# Patient Record
Sex: Male | Born: 1975 | Race: Black or African American | Hispanic: No | Marital: Single | Smoking: Current every day smoker
Health system: Southern US, Community
[De-identification: ages and names within clinical notes are randomized; demographics above are authoritative.]

## PROBLEM LIST (undated history)

## (undated) DIAGNOSIS — E119 Type 2 diabetes mellitus without complications: Secondary | ICD-10-CM

## (undated) DIAGNOSIS — M549 Dorsalgia, unspecified: Secondary | ICD-10-CM

---

## 2010-08-28 ENCOUNTER — Emergency Department (HOSPITAL_COMMUNITY)
Admission: EM | Admit: 2010-08-28 | Discharge: 2010-08-28 | Disposition: A | Discharge status: Home / Self Care | Payer: BC Managed Care – PPO | Attending: Emergency Medicine | Admitting: Emergency Medicine

## 2010-08-28 ENCOUNTER — Emergency Department (HOSPITAL_COMMUNITY): Payer: BC Managed Care – PPO

## 2010-08-28 DIAGNOSIS — M545 Low back pain, unspecified: Secondary | ICD-10-CM | POA: Insufficient documentation

## 2010-08-28 DIAGNOSIS — M51379 Other intervertebral disc degeneration, lumbosacral region without mention of lumbar back pain or lower extremity pain: Secondary | ICD-10-CM | POA: Insufficient documentation

## 2010-08-28 DIAGNOSIS — M79609 Pain in unspecified limb: Secondary | ICD-10-CM | POA: Insufficient documentation

## 2010-08-28 DIAGNOSIS — M5137 Other intervertebral disc degeneration, lumbosacral region: Secondary | ICD-10-CM | POA: Insufficient documentation

## 2012-01-17 ENCOUNTER — Encounter (HOSPITAL_COMMUNITY): Payer: Self-pay | Admitting: Emergency Medicine

## 2012-01-17 ENCOUNTER — Emergency Department (HOSPITAL_COMMUNITY)
Admission: EM | Admit: 2012-01-17 | Discharge: 2012-01-17 | Disposition: A | Discharge status: Home / Self Care | Payer: BC Managed Care – PPO | Source: Home / Self Care | Attending: Emergency Medicine | Admitting: Emergency Medicine

## 2012-01-17 DIAGNOSIS — M545 Low back pain: Secondary | ICD-10-CM

## 2012-01-17 MED ORDER — TRIAMCINOLONE ACETONIDE 40 MG/ML IJ SUSP
60.0000 mg | Freq: Once | INTRAMUSCULAR | Status: AC
  Administered 2012-01-17: 60 mg via INTRAMUSCULAR

## 2012-01-17 MED ORDER — METHYLPREDNISOLONE 4 MG PO KIT: PACK | ORAL | Status: AC

## 2012-01-17 MED ORDER — CYCLOBENZAPRINE HCL 10 MG PO TABS: 10.0000 mg | ORAL_TABLET | Freq: Two times a day (BID) | ORAL | Status: AC | PRN

## 2012-01-17 MED ORDER — TRIAMCINOLONE ACETONIDE 40 MG/ML IJ SUSP
INTRAMUSCULAR | Status: AC
  Filled 2012-01-17: qty 5

## 2012-01-17 MED ORDER — TRAMADOL HCL 50 MG PO TABS: 50.0000 mg | ORAL_TABLET | Freq: Four times a day (QID) | ORAL | Status: AC | PRN
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
## 2012-01-17 NOTE — ED Notes (Signed)
Pt here with right lower sudden sharp back pain radiating to left leg intermit.sx started last Sunday then worsened with diff bending,stand or sit.ibuprofen use.denies urine problems or injury

## 2012-01-18 NOTE — ED Provider Notes (Signed)
Medical screening examination/treatment/procedure(s) were performed by non-physician practitioner and as supervising physician I was immediately available for consultation/collaboration.  Luiz Blare MD   Luiz Blare, MD 01/18/12 2102

## 2012-05-19 ENCOUNTER — Emergency Department (HOSPITAL_COMMUNITY)
Admission: EM | Admit: 2012-05-19 | Discharge: 2012-05-19 | Disposition: A | Discharge status: Home / Self Care | Payer: BC Managed Care – PPO | Source: Home / Self Care | Attending: Emergency Medicine | Admitting: Emergency Medicine

## 2012-05-19 ENCOUNTER — Encounter (HOSPITAL_COMMUNITY): Payer: Self-pay | Admitting: Emergency Medicine

## 2012-05-19 DIAGNOSIS — M5430 Sciatica, unspecified side: Secondary | ICD-10-CM

## 2012-05-19 DIAGNOSIS — M5431 Sciatica, right side: Secondary | ICD-10-CM

## 2012-05-19 MED ORDER — TRIAMCINOLONE ACETONIDE 40 MG/ML IJ SUSP
INTRAMUSCULAR | Status: AC
  Filled 2012-05-19: qty 5

## 2012-05-19 MED ORDER — TRIAMCINOLONE ACETONIDE 40 MG/ML IJ SUSP
60.0000 mg | Freq: Once | INTRAMUSCULAR | Status: AC
  Administered 2012-05-19: 60 mg via INTRAMUSCULAR

## 2012-05-19 MED ORDER — TRAMADOL HCL 50 MG PO TABS: 50.0000 mg | ORAL_TABLET | Freq: Four times a day (QID) | ORAL | Status: DC | PRN

## 2012-05-19 MED ORDER — CYCLOBENZAPRINE HCL 10 MG PO TABS: 10.0000 mg | ORAL_TABLET | Freq: Two times a day (BID) | ORAL | Status: DC | PRN

## 2012-05-19 MED ORDER — METHYLPREDNISOLONE 4 MG PO KIT: PACK | ORAL | Status: DC

## 2012-05-19 NOTE — ED Provider Notes (Signed)
History     CSN: [id number]  Arrival date & time 05/19/12  1010   First MD Initiated Contact with Patient 05/19/12 1325      Chief Complaint  Patient presents with  . Back Pain    (Consider location/radiation/quality/duration/timing/severity/associated sxs/prior treatment) HPI Comments: Pt with similar sx this summer. Received tx here, sx resolved completely.  Pain in R lower back/R buttock that will radiate down his R leg and make his R leg feel numb/tingly if he sits. Carries wallet in R back pocket.  Works doing lots of walking and sitting.  Sx much worse with sitting.   Patient is a 36 y.o. male presenting with back pain. The history is provided by the patient.  Back Pain  This is a new problem. The current episode started more than 1 week ago. The problem occurs constantly. The problem has been gradually worsening. The pain is associated with no known injury. The pain is present in the lumbar spine and gluteal region. The quality of the pain is described as shooting. The pain radiates to the right thigh. The pain is severe. The symptoms are aggravated by certain positions. The pain is the same all the time. Associated symptoms include numbness and tingling. Pertinent negatives include no fever, no abdominal pain, no bowel incontinence, no perianal numbness, no bladder incontinence and no weakness. He has tried NSAIDs for the symptoms. The treatment provided no relief.    History reviewed. No pertinent past medical history.  History reviewed. No pertinent past surgical history.  History reviewed. No pertinent family history.  History  Substance Use Topics  . Smoking status: Current Every Day Smoker  . Smokeless tobacco: Not on file  . Alcohol Use: Yes      Review of Systems  Constitutional: Negative for fever and chills.  Gastrointestinal: Negative for abdominal pain and bowel incontinence.  Genitourinary: Negative for bladder incontinence and difficulty urinating.    Musculoskeletal: Positive for back pain.  Skin: Negative for rash and wound.  Neurological: Positive for tingling and numbness. Negative for weakness.    Allergies  Review of patient's allergies indicates no known allergies.  Home Medications   Current Outpatient Rx  Name  Route  Sig  Dispense  Refill  . CYCLOBENZAPRINE HCL 10 MG PO TABS   Oral   Take 1 tablet (10 mg total) by mouth 2 (two) times daily as needed for muscle spasms.   20 tablet   0   . METHYLPREDNISOLONE 4 MG PO KIT      follow package directions   21 tablet   0   . TRAMADOL HCL 50 MG PO TABS   Oral   Take 1 tablet (50 mg total) by mouth every 6 (six) hours as needed for pain.   15 tablet   0     BP 153/93  Pulse 57  Temp 98.8 F (37.1 C) (Oral)  Resp 19  SpO2 97%  Physical Exam  Constitutional: He appears well-developed and well-nourished.       Appears uncomfortable  Musculoskeletal:       Lumbar back: He exhibits tenderness. He exhibits no bony tenderness, no swelling, no edema, no deformity and no spasm.       Back:  Neurological: He has normal strength. No sensory deficit.  Reflex Scores:      Patellar reflexes are 2+ on the right side and 2+ on the left side.      Achilles reflexes are 2+ on the right side and  2+ on the left side. Skin: Skin is warm, dry and intact. No rash noted.    ED Course  Procedures (including critical care time)  Labs Reviewed - No data to display No results found.   1. Sciatica of right side       MDM  Pt requests tx he received in July since it worked so well for him.  Rx tramadol, flexeril and medrol as before. Given IM Kenalog as before.         Cathlyn Parsons, NP 05/19/12 1334
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
## 2012-05-19 NOTE — ED Provider Notes (Signed)
Medical screening examination/treatment/procedure(s) were performed by non-physician practitioner and as supervising physician I was immediately available for consultation/collaboration.  Leslee Home, M.D.   Reuben Likes, MD 05/19/12 [phone]

## 2014-01-14 ENCOUNTER — Other Ambulatory Visit: Payer: Self-pay | Admitting: Internal Medicine

## 2014-01-14 DIAGNOSIS — M545 Low back pain, unspecified: Secondary | ICD-10-CM

## 2014-01-22 ENCOUNTER — Ambulatory Visit
Admission: RE | Admit: 2014-01-22 | Discharge: 2014-01-22 | Disposition: A | Discharge status: Home / Self Care | Payer: BC Managed Care – PPO | Source: Ambulatory Visit | Attending: Internal Medicine | Admitting: Internal Medicine

## 2014-01-22 DIAGNOSIS — M545 Low back pain, unspecified: Secondary | ICD-10-CM

## 2014-02-01 ENCOUNTER — Emergency Department (HOSPITAL_COMMUNITY)
Admission: EM | Admit: 2014-02-01 | Discharge: 2014-02-01 | Disposition: A | Discharge status: Nursing Home (Includes ICF) | Payer: BC Managed Care – PPO | Source: Home / Self Care | Attending: Family Medicine | Admitting: Family Medicine

## 2014-02-01 ENCOUNTER — Encounter (HOSPITAL_COMMUNITY): Payer: Self-pay | Admitting: Emergency Medicine

## 2014-02-01 ENCOUNTER — Emergency Department (HOSPITAL_COMMUNITY)
Admission: EM | Admit: 2014-02-01 | Discharge: 2014-02-02 | Disposition: A | Discharge status: Home / Self Care | Payer: BC Managed Care – PPO | Attending: Emergency Medicine | Admitting: Emergency Medicine

## 2014-02-01 ENCOUNTER — Emergency Department (HOSPITAL_COMMUNITY): Payer: BC Managed Care – PPO

## 2014-02-01 DIAGNOSIS — R1115 Cyclical vomiting syndrome unrelated to migraine: Secondary | ICD-10-CM

## 2014-02-01 DIAGNOSIS — Z791 Long term (current) use of non-steroidal anti-inflammatories (NSAID): Secondary | ICD-10-CM | POA: Insufficient documentation

## 2014-02-01 DIAGNOSIS — R1013 Epigastric pain: Secondary | ICD-10-CM | POA: Insufficient documentation

## 2014-02-01 DIAGNOSIS — F17200 Nicotine dependence, unspecified, uncomplicated: Secondary | ICD-10-CM | POA: Insufficient documentation

## 2014-02-01 DIAGNOSIS — R112 Nausea with vomiting, unspecified: Secondary | ICD-10-CM | POA: Insufficient documentation

## 2014-02-01 HISTORY — DX: Dorsalgia, unspecified: M54.9

## 2014-02-01 LAB — COMPREHENSIVE METABOLIC PANEL
ALBUMIN: 4.4 g/dL (ref 3.5–5.2)
ALK PHOS: 106 U/L (ref 39–117)
ALT: 55 U/L — AB (ref 0–53)
AST: 30 U/L (ref 0–37)
Anion gap: 15 (ref 5–15)
BUN: 9 mg/dL (ref 6–23)
CALCIUM: 9.1 mg/dL (ref 8.4–10.5)
CO2: 21 mEq/L (ref 19–32)
Chloride: 102 mEq/L (ref 96–112)
Creatinine, Ser: 0.72 mg/dL (ref 0.50–1.35)
GFR calc non Af Amer: >90 mL/min (ref >90)
GLUCOSE: 150 mg/dL — AB (ref 70–99)
POTASSIUM: 4.2 meq/L (ref 3.7–5.3)
SODIUM: 138 meq/L (ref 137–147)
TOTAL PROTEIN: 7.5 g/dL (ref 6.0–8.3)
Total Bilirubin: 0.4 mg/dL (ref 0.3–1.2)

## 2014-02-01 LAB — LIPASE, BLOOD: Lipase: 23 U/L (ref 11–59)

## 2014-02-01 LAB — POCT URINALYSIS DIP (DEVICE)
BILIRUBIN URINE: NEGATIVE
GLUCOSE, UA: NEGATIVE mg/dL
KETONES UR: NEGATIVE mg/dL
LEUKOCYTES UA: NEGATIVE
NITRITE: NEGATIVE
Protein, ur: NEGATIVE mg/dL
Specific Gravity, Urine: 1.02 (ref 1.005–1.030)
Urobilinogen, UA: 0.2 mg/dL (ref 0.0–1.0)
pH: 7 (ref 5.0–8.0)

## 2014-02-01 LAB — CBC
HCT: 44.1 % (ref 39.0–52.0)
HEMOGLOBIN: 15.2 g/dL (ref 13.0–17.0)
MCH: 31.1 pg (ref 26.0–34.0)
MCHC: 34.5 g/dL (ref 30.0–36.0)
MCV: 90.2 fL (ref 78.0–100.0)
PLATELETS: 226 10*3/uL (ref 150–400)
RBC: 4.89 MIL/uL (ref 4.22–5.81)
RDW: 12.6 % (ref 11.5–15.5)
WBC: 10.4 10*3/uL (ref 4.0–10.5)

## 2014-02-01 LAB — OCCULT BLOOD, POC DEVICE: Fecal Occult Bld: POSITIVE — AB

## 2014-02-01 MED ORDER — IOHEXOL 300 MG/ML  SOLN
25.0000 mL | INTRAMUSCULAR | Status: AC
  Administered 2014-02-01 (×2): 25 mL via ORAL

## 2014-02-01 MED ORDER — HYDROMORPHONE HCL PF 1 MG/ML IJ SOLN
1.0000 mg | Freq: Once | INTRAMUSCULAR | Status: AC
  Administered 2014-02-02: 1 mg via INTRAVENOUS
  Filled 2014-02-01: qty 1

## 2014-02-01 MED ORDER — PANTOPRAZOLE SODIUM 20 MG PO TBEC: 20.0000 mg | DELAYED_RELEASE_TABLET | Freq: Every day | ORAL | Status: DC

## 2014-02-01 MED ORDER — ONDANSETRON HCL 4 MG/2ML IJ SOLN
INTRAMUSCULAR | Status: AC
  Filled 2014-02-01: qty 2

## 2014-02-01 MED ORDER — ONDANSETRON HCL 4 MG/2ML IJ SOLN
4.0000 mg | Freq: Once | INTRAMUSCULAR | Status: AC
  Administered 2014-02-01: 4 mg via INTRAVENOUS

## 2014-02-01 MED ORDER — FENTANYL CITRATE 0.05 MG/ML IJ SOLN
INTRAMUSCULAR | Status: AC
  Administered 2014-02-01: 50 ug via INTRAVENOUS
  Filled 2014-02-01: qty 2

## 2014-02-01 MED ORDER — SODIUM CHLORIDE 0.9 % IV SOLN
Freq: Once | INTRAVENOUS | Status: AC
  Administered 2014-02-01: 15:00:00 via INTRAVENOUS

## 2014-02-01 MED ORDER — ONDANSETRON 8 MG PO TBDP: 8.0000 mg | ORAL_TABLET | Freq: Three times a day (TID) | ORAL | Status: DC | PRN

## 2014-02-01 MED ORDER — ONDANSETRON HCL 4 MG/2ML IJ SOLN
INTRAMUSCULAR | Status: AC
  Administered 2014-02-01: 4 mg via INTRAVENOUS
  Filled 2014-02-01: qty 2

## 2014-02-01 MED ORDER — OXYCODONE-ACETAMINOPHEN 5-325 MG PO TABS: 1.0000 | ORAL_TABLET | ORAL | Status: DC | PRN

## 2014-02-01 MED ORDER — PANTOPRAZOLE SODIUM 40 MG IV SOLR
80.0000 mg | Freq: Once | INTRAVENOUS | Status: AC
Start: 2014-02-01 — End: 2014-02-01
  Administered 2014-02-01: 80 mg via INTRAVENOUS
  Filled 2014-02-01: qty 80

## 2014-02-01 MED ORDER — IOHEXOL 300 MG/ML  SOLN
100.0000 mL | Freq: Once | INTRAMUSCULAR | Status: AC | PRN
  Administered 2014-02-01: 100 mL via INTRAVENOUS

## 2014-02-01 MED ORDER — FENTANYL CITRATE 0.05 MG/ML IJ SOLN
50.0000 ug | Freq: Once | INTRAMUSCULAR | Status: AC
  Administered 2014-02-01: 50 ug via INTRAVENOUS

## 2014-02-01 NOTE — ED Notes (Signed)
Pt reports generalized abd cramping that awoke him from his sleep approx 03:00 this morning - admits to associating n/v/d as well - denies any known fever. Pt's family concerned for "food poisoning"

## 2014-02-01 NOTE — ED Notes (Signed)
Pt. had 950 cc IV fluids.  Vomited 50 cc clear liquid after IV Zofran.  Pt. assisted to BR to obtain a urine sample.  Pt. c/o abdominal pain and sweating after vomiting.  PA notified and said pt. needs transferred by shuttle.  IV D/C down to saline lock.  VS rechecked by tech. Vassie MoselleYork, Rody Keadle M 02/01/2014

## 2014-02-01 NOTE — Discharge Instructions (Signed)

## 2014-02-01 NOTE — ED Provider Notes (Signed)
Medical screening examination/treatment/procedure(s) were performed by resident physician or non-physician practitioner and as supervising physician I was immediately available for consultation/collaboration.   Dastan Krider DOUGLAS MD.   Matalyn Nawaz D Careena Degraffenreid, MD 02/01/14 1747 

## 2014-02-01 NOTE — ED Notes (Signed)
C/o upper abdominal pain.  Pain woke him at 0330.  Vomited 7-8 times and diarrhea x 5.  States he ate a cheeseburger and salad bar from [employer] [place] yesterday  @ 1500 and 2 sausages last night.  No fever. Not been around anyone sick with same symptoms.

## 2014-02-01 NOTE — ED Provider Notes (Signed)
CSN: [id number]     Arrival date & time 02/01/14  1437 History   First MD Initiated Contact with Patient 02/01/14 1459     Chief Complaint  Patient presents with  . Abdominal Pain   (Consider location/radiation/quality/duration/timing/severity/associated sxs/prior Treatment) HPI Comments: Denies urinary symptoms. Denies hematemesis or melena. No heavy ETOH use Is a smoker No previous surgeries.  Is taking diclofenac as prescribed for chronic back pain No recent travel No fever Works for Arch[employer] No known ill contacts  Patient is a 38 y.o. male presenting with abdominal pain. The history is provided by the patient.  Abdominal Pain Pain location:  Epigastric Pain quality: sharp   Pain severity:  Severe Onset quality:  Sudden (Woke patient from sleep at 330am) Timing:  Constant Progression:  Worsening Chronicity:  New Associated symptoms: belching, diarrhea, nausea and vomiting   Associated symptoms: no chest pain, no chills, no cough, no dysuria, no fatigue, no fever, no flatus, no hematemesis, no hematochezia, no hematuria, no melena, no shortness of breath, no sore throat, no vaginal bleeding and no vaginal discharge     Past Medical History  Diagnosis Date  . 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 No pertinent past surgical history. History reviewed. No pertinent family history. History  Substance Use Topics  . Smoking status: Current Every Day Smoker -- 0.75 packs/day    Types: Cigarettes  . Smokeless tobacco: Not on file  . Alcohol Use: 2.4 oz/week    4 Cans of beer per week    Review of Systems  Constitutional: Negative for fever, chills and fatigue.  HENT: Negative for sore throat.   Respiratory: Negative for cough and shortness of breath.   Cardiovascular: Negative for chest pain.  Gastrointestinal: Positive for nausea, vomiting, abdominal pain and diarrhea. Negative for melena, hematochezia, flatus and  hematemesis.  Genitourinary: Negative for dysuria, hematuria, vaginal bleeding and vaginal discharge.  All other systems reviewed and are negative.   Allergies  Review of patient's allergies indicates no known allergies.  Home Medications   Prior to Admission medications   Medication Sig Start Date End Date Taking? Authorizing Provider  diclofenac (VOLTAREN) 50 MG EC tablet Take 50 mg by mouth 2 (two) times daily.   Yes Historical Provider, MD  cyclobenzaprine (FLEXERIL) 10 MG tablet Take 1 tablet (10 mg total) by mouth 2 (two) times daily as needed for muscle spasms. 05/19/12   Cathlyn Parsons, NP  methylPREDNISolone (MEDROL, PAK,) 4 MG tablet follow package directions 05/19/12   Cathlyn Parsons, NP  traMADol (ULTRAM) 50 MG tablet Take 1 tablet (50 mg total) by mouth every 6 (six) hours as needed for pain. 05/19/12   Cathlyn Parsons, NP   Pulse 48  Temp(Src) 98.3 F (36.8 C) (Oral)  SpO2 99% Physical Exam  Nursing note and vitals reviewed. Constitutional: He is oriented to person, place, and time. He appears well-developed and well-nourished. No distress.  +appears uncomfortable  HENT:  Head: Normocephalic and atraumatic.  Eyes: Conjunctivae are normal. No scleral icterus.  Neck: Normal range of motion. Neck supple.  Cardiovascular: Regular rhythm and normal heart sounds.  Bradycardia present.   Pulmonary/Chest: Effort normal and breath sounds normal.  Abdominal: Soft. Normal appearance. He exhibits no distension and no mass. Bowel sounds are decreased. There is no hepatosplenomegaly. There is tenderness in the epigastric area. There is no rigidity, no rebound, no guarding and no CVA tenderness.  Genitourinary:  Guaiac positive stool.  Musculoskeletal: Normal range of motion.  Neurological: He is alert and oriented to person, place, and time.  Skin: Skin is warm and dry. No rash noted. No erythema.  Psychiatric: He has a normal mood and affect. His behavior is normal.    ED Course   Procedures (including critical care time) Labs Review Labs Reviewed  COMPREHENSIVE METABOLIC PANEL - Abnormal; Notable for the following:    Glucose, Bld 150 (*)    ALT 55 (*)    All other components within normal limits  POCT URINALYSIS DIP (DEVICE) - Abnormal; Notable for the following:    Hgb urine dipstick TRACE (*)    All other components within normal limits  CBC  LIPASE, BLOOD  OCCULT BLOOD X 1 CARD TO LAB, STOOL    Imaging Review No results found.   MDM   1. Epigastric pain   2. Persistent vomiting in adult patient    Guiac positive stools CBC normal CMET grossly normal.  Lipase normal UA only with trace hematuria 12 lead EKG: sinus bradycardia at 47 bpm, otherwise normal. Hemodynamically stable.  Patient given 1L [facility] at [facility] and 4 mg IV Zofran and continues to vomit and report persistent epigastric discomfort. ??Gastritis from recent course of NSAIDs. Will transfer for continued hydration, IV antiemetics,  IV PPIs, +/-admission for observation.    Jess BartersJennifer Lee [place], [place] 02/01/14 [phone]

## 2014-02-01 NOTE — ED Provider Notes (Signed)
CSN: [id number]     Arrival date & time 02/01/14  1709 History   First MD Initiated Contact with Patient 02/01/14 2027     Chief Complaint  Patient presents with  . Abdominal Pain  . Nausea  . Emesis     (Consider location/radiation/quality/duration/timing/severity/associated sxs/prior Treatment) HPI 38 year old male transferred here from urgent care Center after presenting for abdominal pain. He states that he woke up (the morning with epigastric pain. It is severe. He has had nausea and vomiting. He has had several loose stools. He has not had fever or chills. He has not noted any bright red blood or dark stool for blood in emesis. He is taking diclofenac for back pain. He denies any history of gastritis, ulcer disease, or pancreatitis. He does drink some alcohol but does not drink excessive amounts of alcohol. He had 24 ounces of beer last night. Past Medical History  Diagnosis Date  . 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 No pertinent past surgical history. No family history on file. History  Substance Use Topics  . Smoking status: Current Every Day Smoker -- 0.75 packs/day    Types: Cigarettes  . Smokeless tobacco: Not on file  . Alcohol Use: 2.4 oz/week    4 Cans of beer per week    Review of Systems  All other systems reviewed and are negative.     Allergies  Review of patient's allergies indicates no known allergies.  Home Medications   Prior to Admission medications   Medication Sig Start Date End Date Taking? Authorizing Provider  diclofenac (VOLTAREN) 50 MG EC tablet Take 50 mg by mouth 2 (two) times daily.   Yes Historical Provider, MD   BP 132/84  Pulse 52  Temp(Src) 99.2 F (37.3 C) (Oral)  Resp 16  Ht 5\' 8"  (1.727 m)  Wt 176 lb (79.833 kg)  BMI 26.77 kg/m2  SpO2 99% Physical Exam  Nursing note and vitals reviewed. Constitutional: He is oriented to person, place, and time. He appears well-developed and  well-nourished.  HENT:  Head: Normocephalic and atraumatic.  Right Ear: External ear normal.  Left Ear: External ear normal.  Nose: Nose normal.  Mouth/Throat: Oropharynx is clear and moist.  Eyes: Conjunctivae and EOM are normal. Pupils are equal, round, and reactive to light.  Neck: Normal range of motion. Neck supple.  Cardiovascular: Normal rate, regular rhythm, normal heart sounds and intact distal pulses.   Pulmonary/Chest: Effort normal and breath sounds normal. No respiratory distress. He has no wheezes. He exhibits no tenderness.  Abdominal: Soft. Bowel sounds are normal. He exhibits no distension and no mass. There is tenderness. There is no guarding.    Musculoskeletal: Normal range of motion.  Neurological: He is alert and oriented to person, place, and time. He has normal reflexes. He exhibits normal muscle tone. Coordination normal.  Skin: Skin is warm and dry.  Psychiatric: He has a normal mood and affect. His behavior is normal. Judgment and thought content normal.    ED Course  Procedures (including critical care time) Labs Review Labs Reviewed - No data to display  Imaging Review Ct Abdomen Pelvis W Contrast  02/01/2014   CLINICAL DATA:  Abdominal pain.  EXAM: CT ABDOMEN AND PELVIS WITH CONTRAST  TECHNIQUE: Multidetector CT imaging of the abdomen and pelvis was performed using the standard protocol following bolus administration of intravenous contrast.  CONTRAST:  <MEASUREME4<MEASUREMENT75mT> OMNIPAQUE IOHEXOL 300 MG/ML  SOLN  COMPARISON:  None.  FINDINGS: BODY WALL: Unremarkable.  LOWER CHEST: Unremarkable.  ABDOMEN/PELVIS:  Liver: Diffuse fatty infiltration, sparing the gallbladder fossa. No focal abnormality.  Biliary: No evidence of biliary obstruction or stone.  Pancreas: Unremarkable.  Spleen: Unremarkable.  Adrenals: Unremarkable.  Kidneys and ureters: No hydronephrosis or stone.  Bladder: Unremarkable.  Reproductive: Unremarkable.  Bowel: No obstruction. Few colonic diverticula  distally. No inflammatory wall thickening. Negative appendix. Retroperitoneum: No mass or adenopathy.  Peritoneum: No ascites or pneumoperitoneum.  Vascular: No acute abnormality.Extensive for age iliac atherosclerotic calcification.  OSSEOUS: Central disc herniations at L4-5 and L5-S1, narrowing the spinal canal. Disc narrowing and endplate spurring at L5-S1 causes notable bilateral foraminal stenosis.  IMPRESSION: 1. No acute intra-abdominal findings. 2. Atherosclerosis and hepatic steatosis.   Electronically Signed   By: Tiburcio PeaJonathan  Watts M.D.   On: 02/01/2014 23:23     EKG Interpretation None      MDM   Final diagnoses:  Epigastric pain        Hilario Quarryanielle S Corrado Hymon, MD 02/02/14 1239

## 2014-02-01 NOTE — ED Notes (Signed)
Pt from urgent care for further eval of abdominal pain with N/v onset 0300 today.

## 2016-01-15 ENCOUNTER — Emergency Department (HOSPITAL_COMMUNITY)
Admission: EM | Admit: 2016-01-15 | Discharge: 2016-01-15 | Disposition: A | Discharge status: Home / Self Care | Payer: Self-pay | Attending: Emergency Medicine | Admitting: Emergency Medicine

## 2016-01-15 ENCOUNTER — Encounter (HOSPITAL_COMMUNITY): Payer: Self-pay

## 2016-01-15 DIAGNOSIS — E139 Other specified diabetes mellitus without complications: Secondary | ICD-10-CM | POA: Insufficient documentation

## 2016-01-15 DIAGNOSIS — F17210 Nicotine dependence, cigarettes, uncomplicated: Secondary | ICD-10-CM | POA: Insufficient documentation

## 2016-01-15 LAB — I-STAT VENOUS BLOOD GAS, ED
Acid-base deficit: 1 mmol/L (ref 0.0–2.0)
Bicarbonate: 23.5 mEq/L (ref 20.0–24.0)
O2 Saturation: 87 %
PCO2 VEN: 39 mmHg — AB (ref 45.0–50.0)
PH VEN: 7.388 — AB (ref 7.250–7.300)
TCO2: 25 mmol/L (ref 0–100)
pO2, Ven: 54 mmHg — ABNORMAL HIGH (ref 31.0–45.0)

## 2016-01-15 LAB — CBG MONITORING, ED
GLUCOSE-CAPILLARY: 390 mg/dL — AB (ref 65–99)
Glucose-Capillary: 254 mg/dL — ABNORMAL HIGH (ref 65–99)

## 2016-01-15 LAB — BASIC METABOLIC PANEL
ANION GAP: 12 (ref 5–15)
BUN: 9 mg/dL (ref 6–20)
CHLORIDE: 96 mmol/L — AB (ref 101–111)
CO2: 21 mmol/L — AB (ref 22–32)
Calcium: 9.4 mg/dL (ref 8.9–10.3)
Creatinine, Ser: 1.01 mg/dL (ref 0.61–1.24)
GFR calc Af Amer: >60 mL/min (ref >60)
GFR calc non Af Amer: >60 mL/min (ref >60)
GLUCOSE: 420 mg/dL — AB (ref 65–99)
Potassium: 3.7 mmol/L (ref 3.5–5.1)
Sodium: 129 mmol/L — ABNORMAL LOW (ref 135–145)

## 2016-01-15 LAB — URINALYSIS, ROUTINE W REFLEX MICROSCOPIC
Bilirubin Urine: NEGATIVE
Glucose, UA: >1000 mg/dL — AB
Ketones, ur: 40 mg/dL — AB
Leukocytes, UA: NEGATIVE
Nitrite: NEGATIVE
Protein, ur: NEGATIVE mg/dL
Specific Gravity, Urine: 1.041 — ABNORMAL HIGH (ref 1.005–1.030)
pH: 5.5 (ref 5.0–8.0)

## 2016-01-15 LAB — CBC WITH DIFFERENTIAL/PLATELET
Basophils Absolute: 0 10*3/uL (ref 0.0–0.1)
Basophils Relative: 0 %
Eosinophils Absolute: 0.1 10*3/uL (ref 0.0–0.7)
Eosinophils Relative: 1 %
HCT: 43 % (ref 39.0–52.0)
Hemoglobin: 15.4 g/dL (ref 13.0–17.0)
Lymphocytes Relative: 22 %
Lymphs Abs: 1.9 10*3/uL (ref 0.7–4.0)
MCH: 30.9 pg (ref 26.0–34.0)
MCHC: 35.8 g/dL (ref 30.0–36.0)
MCV: 86.3 fL (ref 78.0–100.0)
Monocytes Absolute: 0.3 10*3/uL (ref 0.1–1.0)
Monocytes Relative: 4 %
Neutro Abs: 6.4 10*3/uL (ref 1.7–7.7)
Neutrophils Relative %: 73 %
Platelets: 261 10*3/uL (ref 150–400)
RBC: 4.98 MIL/uL (ref 4.22–5.81)
RDW: 11.5 % (ref 11.5–15.5)
WBC: 8.7 10*3/uL (ref 4.0–10.5)

## 2016-01-15 LAB — URINE MICROSCOPIC-ADD ON
Bacteria, UA: NONE SEEN
Squamous Epithelial / LPF: NONE SEEN
WBC, UA: NONE SEEN WBC/hpf (ref 0–5)

## 2016-01-15 MED ORDER — METFORMIN HCL 500 MG PO TABS: 500.0000 mg | ORAL_TABLET | Freq: Two times a day (BID) | ORAL | Status: DC

## 2016-01-15 MED ORDER — SODIUM CHLORIDE 0.9 % IV BOLUS (SEPSIS)
1000.0000 mL | Freq: Once | INTRAVENOUS | Status: AC
  Administered 2016-01-15: 1000 mL via INTRAVENOUS

## 2016-01-15 NOTE — ED Notes (Addendum)
Pt. sts he has noticed that his vision has been blurry the past few days and increase in thirst and urination, and fatigue. 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 CBG in 300's in triage

## 2016-01-15 NOTE — ED Notes (Signed)
D/C  Instructions reviewed with patient and pt. Verbalizes understanding of follow up appoint,emts. Pt.vitals are stable and he is leaving ambulatory alone. Pt. Denies questions at this time

## 2016-01-15 NOTE — Discharge Instructions (Signed)
Take the prescribed medication as directed.  It is common for this medication to cause some GI upset so monitor for any diarrhea, nausea, or vomiting. If symptoms become uncontrollable, stop this medication. Watch your diet for carbohydrates and sugar intake. Make sure you're drinking plenty of water to stay hydrated. Follow-up with your primary care doctor-- no labs or on the back for their review. Return to the ED for new or worsening symptoms.

## 2016-01-15 NOTE — ED Provider Notes (Signed)
CSN: [id number]     Arrival date & time 01/15/16  4098 History   First MD Initiated Contact with Patient 01/15/16 [phone]     Chief Complaint  Patient presents with  . Blurred Vision  . Weakness     (Consider location/radiation/quality/duration/timing/severity/associated sxs/prior Treatment) Patient is a 40 y.o. male presenting with weakness. The history is provided by the patient and medical records.  Weakness Associated symptoms include fatigue.    40 y.o. m with hx of chronic back pain, presenting to the ED for blurred vision, increased thirst, and urinary frequency for the past 4 days.  Patient states he works outside and in order to stay hydrated he has been drinking a lot of gatorade.  He states One of his colleagues informed him that Gatorade is high in sugar so he switched to water only for the past 2 days. He states he continues to have some intermittently blurred vision and urinary frequency. States he overall feels fatigued. No dysuria, hematuria, flank pain, abdominal pain, nausea, vomiting, or diarrhea.  He denies any headache, dizziness, confusion, numbness, or weakness. No neck pain, fever, or chills. He states his father is a diabetic and when he told him about his symptoms. Encouraged him to come and be seen. Patient has no other medical problems. He has never been diagnosed with diabetes.  VSS.  Past Medical History  Diagnosis Date  . 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 No pertinent past surgical history. No family history on file. Social History  Substance Use Topics  . Smoking status: Current Every Day Smoker -- 0.75 packs/day    Types: Cigarettes  . Smokeless tobacco: None  . Alcohol Use: 2.4 oz/week    4 Cans of beer per week    Review of Systems  Constitutional: Positive for fatigue.  Eyes: Positive for visual disturbance.  Genitourinary: Positive for frequency.  All other systems reviewed and are  negative.     Allergies  Review of patient's allergies indicates no known allergies.  Home Medications   Prior to Admission medications   Medication Sig Start Date End Date Taking? Authorizing Provider  diclofenac (VOLTAREN) 50 MG EC tablet Take 50 mg by mouth 2 (two) times daily.    Historical Provider, MD  ondansetron (ZOFRAN ODT) 8 MG disintegrating tablet Take 1 tablet (8 mg total) by mouth every 8 (eight) hours as needed for nausea or vomiting. 02/01/14   Margarita Grizzle, MD  oxyCODONE-acetaminophen (PERCOCET/ROXICET) 5-325 MG per tablet Take 1 tablet by mouth every 4 (four) hours as needed for severe pain. 02/01/14   Margarita Grizzle, MD  pantoprazole (PROTONIX) 20 MG tablet Take 1 tablet (20 mg total) by mouth daily. 02/01/14   Margarita Grizzle, MD   Temp(Src) 98.6 F (37 C) (Oral)  Ht  (1.702 m)  Wt 77.111 kg  BMI 26.62 kg/m2   Physical Exam  Constitutional: He is oriented to person, place, and time. He appears well-developed and well-nourished.  HENT:  Head: Normocephalic and atraumatic.  Mouth/Throat: Oropharynx is clear and moist.  Mucous membranes dry  Eyes: Conjunctivae and EOM are normal. Pupils are equal, round, and reactive to light.  EOMs fully intact, normal confrontation, no field cuts  Neck: Normal range of motion. Neck supple.  Cardiovascular: Normal rate, regular rhythm and normal heart sounds.   Pulmonary/Chest: Effort normal and breath sounds normal. No respiratory distress. He has no wheezes.  Abdominal: Soft. Bowel sounds are normal.  There is no tenderness. There is no guarding.  Musculoskeletal: Normal range of motion.  Neurological: He is alert and oriented to person, place, and time.  AAOx3, answering questions and following commands appropriately; equal strength UE and LE bilaterally; CN grossly intact; moves all extremities appropriately without ataxia; no focal neuro deficits or facial asymmetry appreciated  Skin: Skin is warm and dry.  Psychiatric: He has a  normal mood and affect.  Nursing note and vitals reviewed.   ED Course  Procedures (including critical care time) Labs Review Labs Reviewed  BASIC METABOLIC PANEL - Abnormal; Notable for the following:    Sodium 129 (*)    Chloride 96 (*)    CO2 21 (*)    Glucose, Bld 420 (*)    All other components within normal limits  URINALYSIS, ROUTINE W REFLEX MICROSCOPIC (NOT AT [facility]) - Abnormal; Notable for the following:    Specific Gravity, Urine 1.041 (*)    Glucose, UA >1000 (*)    Hgb urine dipstick TRACE (*)    Ketones, ur 40 (*)    All other components within normal limits  CBG MONITORING, ED - Abnormal; Notable for the following:    Glucose-Capillary 390 (*)    All other components within normal limits  I-STAT VENOUS BLOOD GAS, ED - Abnormal; Notable for the following:    pH, Ven 7.388 (*)    pCO2, Ven 39.0 (*)    pO2, Ven 54.0 (*)    All other components within normal limits  CBG MONITORING, ED - Abnormal; Notable for the following:    Glucose-Capillary 254 (*)    All other components within normal limits  CBC WITH DIFFERENTIAL/PLATELET  URINE MICROSCOPIC-ADD ON    Imaging Review No results found. I have personally reviewed and evaluated these images and lab results as part of my medical decision-making.   EKG Interpretation None      MDM   Final diagnoses:  Diabetes mellitus of other type without complication [facility](HCC)   40 year old male here with intermittently blurred vision, polyuria, and polydipsia for the past 2 days. He is not a known diabetic. His CBG on arrival was 390. Concern for new onset diabetes. Will obtain labs including CBC, BMP, venous blood gas, urinalysis. Patient given IV fluids.  Neurologic exam non-focal.  11:47 AM   Labs are overall reassuring.  VBG WNL, anion gap remains normal at 12-- clinically not DKA.  renal function preserved.  Patient was fluid resuscitated with 2 L normal saline and states he is feeling better.  His blood sugar has  decreased to around 250.  VS remain stable. Feel he is stable for discharge. We'll start her on low-dose metformin twice a day. He will follow-up with his primary care doctor this week.  Discussed plan with patient, he/she acknowledged understanding and agreed with plan of care.  Return precautions given for new or worsening symptoms.  Garlon HatchetLisa M Rakisha Pincock, PA-C 01/15/16 1241  Derwood KaplanAnkit Nanavati, MD 01/16/16 [phone]

## 2016-02-08 ENCOUNTER — Encounter (HOSPITAL_COMMUNITY): Payer: Self-pay | Admitting: *Deleted

## 2016-02-08 ENCOUNTER — Emergency Department (HOSPITAL_COMMUNITY)
Admission: EM | Admit: 2016-02-08 | Discharge: 2016-02-08 | Disposition: A | Discharge status: Home / Self Care | Payer: Self-pay | Attending: Emergency Medicine | Admitting: Emergency Medicine

## 2016-02-08 DIAGNOSIS — Y939 Activity, unspecified: Secondary | ICD-10-CM | POA: Insufficient documentation

## 2016-02-08 DIAGNOSIS — S0502XA Injury of conjunctiva and corneal abrasion without foreign body, left eye, initial encounter: Secondary | ICD-10-CM | POA: Insufficient documentation

## 2016-02-08 DIAGNOSIS — F17210 Nicotine dependence, cigarettes, uncomplicated: Secondary | ICD-10-CM | POA: Insufficient documentation

## 2016-02-08 DIAGNOSIS — H109 Unspecified conjunctivitis: Secondary | ICD-10-CM | POA: Insufficient documentation

## 2016-02-08 DIAGNOSIS — Z7984 Long term (current) use of oral hypoglycemic drugs: Secondary | ICD-10-CM | POA: Insufficient documentation

## 2016-02-08 DIAGNOSIS — Y999 Unspecified external cause status: Secondary | ICD-10-CM | POA: Insufficient documentation

## 2016-02-08 DIAGNOSIS — Y929 Unspecified place or not applicable: Secondary | ICD-10-CM | POA: Insufficient documentation

## 2016-02-08 DIAGNOSIS — E119 Type 2 diabetes mellitus without complications: Secondary | ICD-10-CM | POA: Insufficient documentation

## 2016-02-08 DIAGNOSIS — X58XXXA Exposure to other specified factors, initial encounter: Secondary | ICD-10-CM | POA: Insufficient documentation

## 2016-02-08 HISTORY — DX: Type 2 diabetes mellitus without complications: E11.9

## 2016-02-08 LAB — CBG MONITORING, ED: Glucose-Capillary: 125 mg/dL — ABNORMAL HIGH (ref 65–99)

## 2016-02-08 MED ORDER — TETRACAINE HCL 0.5 % OP SOLN
1.0000 [drp] | Freq: Once | OPHTHALMIC | Status: AC
  Administered 2016-02-08: 1 [drp] via OPHTHALMIC
  Filled 2016-02-08: qty 2

## 2016-02-08 MED ORDER — FLUORESCEIN SODIUM 1 MG OP STRP
1.0000 | ORAL_STRIP | Freq: Once | OPHTHALMIC | Status: AC
  Administered 2016-02-08: 1 via OPHTHALMIC
  Filled 2016-02-08: qty 1

## 2016-02-08 MED ORDER — CIPROFLOXACIN HCL 0.3 % OP SOLN
1.0000 [drp] | OPHTHALMIC | Status: DC
  Administered 2016-02-08: 1 [drp] via OPHTHALMIC
  Filled 2016-02-08: qty 2.5

## 2016-02-08 NOTE — Discharge Instructions (Signed)
Use the eye drops 1 drop in left eye every two hours while awake tonight and tomorrow and then 1 drop in the left eye every 4 hours while awake.  You Must call Dr. Marnee SpringMcCuen's office in the morning and tell them you were seen in the ED and told to call for follow up. They should schedule you to come in in the next day or two. Return here as needed.

## 2016-02-08 NOTE — ED Triage Notes (Signed)
Left eye redness, swelling and drainage

## 2016-02-08 NOTE — ED Provider Notes (Signed)
[facility]-EMERGENCY DEPT Provider Note   CSN: [id number] Arrival date & time: 02/08/16  1813  First Provider Contact:  None    By signing my name below, I, Majel Homer, attest that this documentation has been prepared under the direction and in the presence of non-physician practitioner, Kerrie Buffalo, NP. Electronically Signed: Majel Homer, Scribe. 02/08/2016. 7:25 PM.  History   Chief Complaint Chief Complaint  Patient presents with  . Eye Problem   The history is provided by the patient. No language interpreter was used.   HPI Comments: Eugene Myers is a 40 y.o. male with PMHx of DM, who presents to the Emergency Department complaining of gradually worsening, left eye swelling and drainage that began 2 weeks ago and worsened today. Pt reports he woke up one morning and experienced spontaneous eye watering, redness and drainage; he notes he continually rubbed his eye because he felt like it "had something in it." He notes he wears contacts but has not worn them since the onset of his left eye pain. He notes he has used Pataday and over the counter medication to treat pink eye with no relief. He denies pain in his left eye, cough, congestion, blurry vision, and contacts at home with similar symptoms. 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   Past Medical History:  Diagnosis Date  . Back pain    MRI low back-2 discs with nerve impingement  . Diabetes mellitus without complication (HCC)    There are no active problems to display for this patient.  History reviewed. No pertinent surgical history.  Home Medications    Prior to Admission medications   Medication Sig Start Date End Date Taking? Authorizing Provider  diclofenac (VOLTAREN) 50 MG EC tablet Take 50 mg by mouth 2 (two) times daily.    Historical Provider, MD  metFORMIN (GLUCOPHAGE) 500 MG tablet Take 1 tablet (500 mg total) by mouth 2 (two) times daily with a meal. 01/15/16    Garlon Hatchet, PA-C  ondansetron (ZOFRAN ODT) 8 MG disintegrating tablet Take 1 tablet (8 mg total) by mouth every 8 (eight) hours as needed for nausea or vomiting. 02/01/14   Margarita Grizzle, MD  oxyCODONE-acetaminophen (PERCOCET/ROXICET) 5-325 MG per tablet Take 1 tablet by mouth every 4 (four) hours as needed for severe pain. 02/01/14   Margarita Grizzle, MD  pantoprazole (PROTONIX) 20 MG tablet Take 1 tablet (20 mg total) by mouth daily. 02/01/14   Margarita Grizzle, MD   Family History History reviewed. No pertinent family history.  Social History Social History  Substance Use Topics  . Smoking status: Current Every Day Smoker    Packs/day: 0.75    Types: Cigarettes  . Smokeless tobacco: Never Used  . Alcohol use 2.4 oz/week    4 Cans of beer per week   Allergies   Review of patient's allergies indicates no known allergies.  Review of Systems Review of Systems  HENT: Negative for congestion.   Eyes: Positive for discharge and redness. Negative for pain and visual disturbance.  Respiratory: Negative for cough.    Physical Exam Updated Vital Signs BP 125/87 (BP Location: Right Arm)   Pulse 68   Temp 98.3 F (36.8 C) (Oral)   Resp 18   Ht  (1.702 m)   Wt 170 lb (77.1 kg)   SpO2 99%   BMI 26.63 kg/m   Physical Exam  Constitutional: He is oriented to person, place, and  time. He appears well-developed and well-nourished. No distress.  HENT:  Head: Normocephalic and atraumatic.  Right Ear: External ear normal.  Left Ear: External ear normal.  Eyes: EOM are normal. Pupils are equal, round, and reactive to light. Lids are everted and swept, no foreign bodies found. Right eye exhibits no discharge. Left eye exhibits discharge. No foreign body present in the left eye. Left conjunctiva is injected. No scleral icterus.  Fundoscopic exam:      The left eye shows exudate.  Slit lamp exam:      The left eye shows fluorescein uptake. The left eye shows no foreign body.  Neck: Normal range  of motion. No JVD present. No tracheal deviation present.  Cardiovascular: Normal rate.   Pulmonary/Chest: Effort normal. No stridor.  Neurological: He is alert and oriented to person, place, and time. Coordination normal.  Psychiatric: He has a normal mood and affect. His behavior is normal. Judgment and thought content normal.  Nursing note and vitals reviewed.  ED Treatments / Results  Labs (all labs ordered are listed, but only abnormal results are displayed) Labs Reviewed  CBG MONITORING, ED - Abnormal; Notable for the following:       Result Value   Glucose-Capillary 125 (*)    All other components within normal limits  Radiology No results found.  Procedures Procedures  DIAGNOSTIC STUDIES:  Oxygen Saturation is 99% on RA, normal by my interpretation.    COORDINATION OF CARE:  7:25 PM Discussed treatment plan with pt at bedside and pt agreed to plan.  Medications Ordered in ED Medications  tetracaine (PONTOCAINE) 0.5 % ophthalmic solution 1 drop (1 drop Left Eye Given 02/08/16 1942)  fluorescein ophthalmic strip 1 strip (1 strip Left Eye Given 02/08/16 1942)   Initial Impression / Assessment and Plan / ED Course  I have reviewed the triage vital signs and the nursing notes.   Clinical Course   Dr. Ranae PalmsYelverton in to examine the patient. Agrees with assess and plan.   Will start Cipro Eye drops and have patient f/u with Opthalmology in the morning. First dose of instilled prior to d/c.   I personally performed the services described in this documentation, which was scribed in my presence. The recorded information has been reviewed and is accurate.   Final Clinical Impressions(s) / ED Diagnoses  40 y.o. male with left eye pain stable for d/c without fever and does not appear toxic. He will f/u with the ophthalmologist as discussed.   Final diagnoses:  Conjunctivitis of left eye  Corneal abrasion, left, initial encounter    New Prescriptions Discharge Medication List  as of 02/08/2016  8:59 PM       [facility] Neese, NP 02/10/16 [phone]    Loren Raceravid Yelverton, MD 02/10/16 1323

## 2018-02-18 ENCOUNTER — Emergency Department (HOSPITAL_COMMUNITY)
Admission: EM | Admit: 2018-02-18 | Discharge: 2018-02-19 | Disposition: A | Discharge status: Home / Self Care | Payer: Self-pay | Attending: Emergency Medicine | Admitting: Emergency Medicine

## 2018-02-18 ENCOUNTER — Encounter (HOSPITAL_COMMUNITY): Payer: Self-pay | Admitting: Emergency Medicine

## 2018-02-18 ENCOUNTER — Other Ambulatory Visit: Payer: Self-pay

## 2018-02-18 DIAGNOSIS — F17210 Nicotine dependence, cigarettes, uncomplicated: Secondary | ICD-10-CM | POA: Insufficient documentation

## 2018-02-18 DIAGNOSIS — M5442 Lumbago with sciatica, left side: Secondary | ICD-10-CM | POA: Insufficient documentation

## 2018-02-18 DIAGNOSIS — Y929 Unspecified place or not applicable: Secondary | ICD-10-CM | POA: Insufficient documentation

## 2018-02-18 DIAGNOSIS — Z7984 Long term (current) use of oral hypoglycemic drugs: Secondary | ICD-10-CM | POA: Insufficient documentation

## 2018-02-18 DIAGNOSIS — Y999 Unspecified external cause status: Secondary | ICD-10-CM | POA: Insufficient documentation

## 2018-02-18 DIAGNOSIS — Y939 Activity, unspecified: Secondary | ICD-10-CM | POA: Insufficient documentation

## 2018-02-18 DIAGNOSIS — X509XXA Other and unspecified overexertion or strenuous movements or postures, initial encounter: Secondary | ICD-10-CM | POA: Insufficient documentation

## 2018-02-18 NOTE — ED Triage Notes (Signed)
Pt states he has had increasing back pain the past 2 days. Hx of same. Has received injections and "blocks" in the past.

## 2018-02-19 MED ORDER — PREDNISONE 20 MG PO TABS: 40.0000 mg | ORAL_TABLET | Freq: Every day | ORAL | 0 refills | Status: DC

## 2018-02-19 MED ORDER — KETOROLAC TROMETHAMINE 30 MG/ML IJ SOLN
30.0000 mg | Freq: Once | INTRAMUSCULAR | Status: AC
  Administered 2018-02-19: 30 mg via INTRAMUSCULAR
  Filled 2018-02-19: qty 1

## 2018-02-19 MED ORDER — CYCLOBENZAPRINE HCL 10 MG PO TABS: 10.0000 mg | ORAL_TABLET | Freq: Two times a day (BID) | ORAL | 0 refills | Status: DC | PRN

## 2018-02-19 MED ORDER — PREDNISONE 20 MG PO TABS
60.0000 mg | ORAL_TABLET | Freq: Once | ORAL | Status: AC
  Administered 2018-02-19: 60 mg via ORAL
  Filled 2018-02-19: qty 3

## 2018-02-19 NOTE — ED Provider Notes (Signed)
[facility] Provider Note   CSN: [id number] Arrival date & time: 02/18/18  2206     History   Chief Complaint Chief Complaint  Patient presents with  . Back Pain    HPI Eugene Myers is a 42 y.o. male.  Patient presents to the emergency department with a chief complaint of low back pain.  He states that he was coughing a few days ago and threw his back out.  He complains of left-sided low back pain.  He complains of pain with movement and palpation.  He states that this is a recurrent problem for him.  He reports that he used to get nerve blocks in his back.  He also reports having had good results with prednisone in the past.  He would like some of this.  He denies any bowel or bladder incontinence.  Denies numbness, or weakness, but states that he does have some tingling and radiating pain down his left leg.  The history is provided by the patient. No language interpreter was used.    Past Medical History:  Diagnosis Date  . Back pain    MRI low back-2 discs with nerve impingement  . Diabetes mellitus without complication (HCC)     There are no active problems to display for this patient.   History reviewed. No pertinent surgical history.      Home Medications    Prior to Admission medications   Medication Sig Start Date End Date Taking? Authorizing Provider  diclofenac (VOLTAREN) 50 MG EC tablet Take 50 mg by mouth 2 (two) times daily.    [provider]  metFORMIN (GLUCOPHAGE) 500 MG tablet Take 1 tablet (500 mg total) by mouth 2 (two) times daily with a meal. 01/15/16   Allyne GeeSanders, Rosezella FloridaLisa M, PA-C  ondansetron (ZOFRAN ODT) 8 MG disintegrating tablet Take 1 tablet (8 mg total) by mouth every 8 (eight) hours as needed for nausea or vomiting. 02/01/14   Margarita Grizzleay, Danielle, MD  oxyCODONE-acetaminophen (PERCOCET/ROXICET) 5-325 MG per tablet Take 1 tablet by mouth every 4 (four) hours as needed for severe pain. 02/01/14   Margarita Grizzleay, Danielle,  MD  pantoprazole (PROTONIX) 20 MG tablet Take 1 tablet (20 mg total) by mouth daily. 02/01/14   Margarita Grizzleay, Danielle, MD    Family History No family history on file.  Social History Social History   Tobacco Use  . Smoking status: Current Every Day Smoker    Packs/day: 0.75    Types: Cigarettes  . Smokeless tobacco: Never Used  Substance Use Topics  . Alcohol use: Yes    Alcohol/week: 4.0 standard drinks    Types: 4 Cans of beer per week  . Drug use: No     Allergies   Patient has no known allergies.   Review of Systems Review of Systems  All other systems reviewed and are negative.    Physical Exam Updated Vital Signs BP (!) 150/100 (BP Location: Right Arm)   Pulse 61   Temp 98.8 F (37.1 C) (Oral)   Resp 17   Ht 5\' 8"  (1.727 m)   Wt 81.6 kg   SpO2 100%   BMI 27.37 kg/m   Physical Exam  Constitutional: He is oriented to person, place, and time. He appears well-developed and well-nourished. No distress.  HENT:  Head: Normocephalic and atraumatic.  Eyes: Conjunctivae and EOM are normal. Right eye exhibits no discharge. Left eye exhibits no discharge. No scleral icterus.  Neck: Normal range of motion. Neck supple.  No tracheal deviation present.  Cardiovascular: Normal rate, regular rhythm and normal heart sounds. Exam reveals no gallop and no friction rub.  No murmur heard. Pulmonary/Chest: Effort normal and breath sounds normal. No respiratory distress. He has no wheezes.  Abdominal: Soft. He exhibits no distension. There is no tenderness.  Musculoskeletal: Normal range of motion.  Left lumbar paraspinal muscles tender to palpation, no bony tenderness, step-offs, or gross abnormality or deformity of spine, patient is able to ambulate, moves all extremities  Bilateral great toe extension intact Bilateral plantar/dorsiflexion intact  Neurological: He is alert and oriented to person, place, and time. He has normal reflexes.  Sensation and strength intact  bilaterally Symmetrical reflexes  Skin: Skin is warm. He is not diaphoretic.  Psychiatric: He has a normal mood and affect. His behavior is normal. Judgment and thought content normal.  Nursing note and vitals reviewed.    ED Treatments / Results  Labs (all labs ordered are listed, but only abnormal results are displayed) Labs Reviewed - No data to display  EKG None  Radiology No results found.  Procedures Procedures (including critical care time)  Medications Ordered in ED Medications  ketorolac (TORADOL) 30 MG/ML injection 30 mg (has no administration in time range)  predniSONE (DELTASONE) tablet 60 mg (has no administration in time range)     Initial Impression / Assessment and Plan / ED Course  I have reviewed the triage vital signs and the nursing notes.  Pertinent labs & imaging results that were available during my care of the patient were reviewed by me and considered in my medical decision making (see chart for details).     Patient with back pain.    No neurological deficits and normal neuro exam.  Patient is ambulatory.  No loss of bowel or bladder control.  Doubt cauda equina.  Denies fever,  doubt epidural abscess or other lesion. Recommend back exercises, stretching, RICE, and will treat with a short course of flexeril and prednisone. Encouraged the patient that there could be a need for additional workup and/or imaging such as MRI, if the symptoms do not resolve. Patient advised that if the back pain does not resolve, or radiates, this could progress to more serious conditions and is encouraged to follow-up with PCP or orthopedics within 2 weeks.     Final Clinical Impressions(s) / ED Diagnoses   Final diagnoses:  Acute left-sided low back pain with left-sided sciatica    ED Discharge Orders         Ordered    cyclobenzaprine (FLEXERIL) 10 MG tablet  2 times daily PRN     02/19/18 0115    predniSONE (DELTASONE) 20 MG tablet  Daily     02/19/18 0115            Roxy HorsemanBrowning, Deniece Rankin, PA-C 02/19/18 0115    Nira Connardama, Pedro Eduardo, MD 02/19/18 [phone]

## 2018-03-06 ENCOUNTER — Other Ambulatory Visit: Payer: Self-pay

## 2018-03-06 ENCOUNTER — Encounter (HOSPITAL_COMMUNITY): Payer: Self-pay | Admitting: Emergency Medicine

## 2018-03-06 ENCOUNTER — Emergency Department (HOSPITAL_COMMUNITY)
Admission: EM | Admit: 2018-03-06 | Discharge: 2018-03-06 | Disposition: A | Discharge status: Home / Self Care | Payer: Self-pay | Attending: Emergency Medicine | Admitting: Emergency Medicine

## 2018-03-06 DIAGNOSIS — Z7984 Long term (current) use of oral hypoglycemic drugs: Secondary | ICD-10-CM | POA: Insufficient documentation

## 2018-03-06 DIAGNOSIS — M5432 Sciatica, left side: Secondary | ICD-10-CM | POA: Insufficient documentation

## 2018-03-06 DIAGNOSIS — Z79899 Other long term (current) drug therapy: Secondary | ICD-10-CM | POA: Insufficient documentation

## 2018-03-06 DIAGNOSIS — E119 Type 2 diabetes mellitus without complications: Secondary | ICD-10-CM | POA: Insufficient documentation

## 2018-03-06 DIAGNOSIS — F17210 Nicotine dependence, cigarettes, uncomplicated: Secondary | ICD-10-CM | POA: Insufficient documentation

## 2018-03-06 MED ORDER — CYCLOBENZAPRINE HCL 10 MG PO TABS: 10.0000 mg | ORAL_TABLET | Freq: Two times a day (BID) | ORAL | 0 refills | Status: DC | PRN

## 2018-03-06 MED ORDER — PREDNISONE 10 MG (21) PO TBPK: ORAL_TABLET | Freq: Every day | ORAL | 0 refills | Status: DC

## 2018-03-06 NOTE — ED Triage Notes (Signed)
Pt reports left lower back pain that shoots down his left leg that has been going on for the last few weeks. Pt reports 6/10 pain, denies any injuries or urinary sx. Pt was here for same thing a few weeks ago and reports the prednisone helped some but pain is back again. Pt ambulatory in triage with limping.

## 2018-03-06 NOTE — ED Provider Notes (Signed)
[facility] EMERGENCY DEPARTMENT Provider Note   CSN: [id number] Arrival date & time: 03/06/18  1933     History   Chief Complaint Chief Complaint  Patient presents with  . Back Pain    HPI Eugene Myers is a 42 y.o. male resenting for evaluation of back pain.  Patient states he has been having left low back pain for the past several weeks.  He was seen in the ER for this recently.  Pain began after he coughed.  He denies fall or injury.  He denies fevers, chills, rash, loss of bowel bladder control, numbness, tingling, history of cancer, or history of IV drug use.  He reports a history of sciatica, at one point was getting back injections although it has been over a year since his last one.  Patient states he is seen here previously he was given a 5-day course of prednisone and Flexeril, which mildly improved his pain.  However, pain has worsened since then.  She states pain begins in his left low back and radiates down his left leg.  Pain is worse with movement and extended standing, nothing makes it better.  He has been taking 1-2 doses of 600 mg of ibuprofen a day without improvement of his symptoms.   HPI  Past Medical History:  Diagnosis Date  . Back pain    MRI low back-2 discs with nerve impingement  . Diabetes mellitus without complication (HCC)     There are no active problems to display for this patient.   History reviewed. No pertinent surgical history.      Home Medications    Prior to Admission medications   Medication Sig Start Date End Date Taking? Authorizing Provider  cyclobenzaprine (FLEXERIL) 10 MG tablet Take 1 tablet (10 mg total) by mouth 2 (two) times daily as needed for muscle spasms. 03/06/18   Jarryd Gratz, PA-C  diclofenac (VOLTAREN) 50 MG EC tablet Take 50 mg by mouth 2 (two) times daily.    [provider]  metFORMIN (GLUCOPHAGE) 500 MG tablet Take 1 tablet (500 mg total) by mouth 2 (two) times daily with a meal.  01/15/16   Allyne Gee, Rosezella Florida, PA-C  ondansetron (ZOFRAN ODT) 8 MG disintegrating tablet Take 1 tablet (8 mg total) by mouth every 8 (eight) hours as needed for nausea or vomiting. 02/01/14   Margarita Grizzle, MD  oxyCODONE-acetaminophen (PERCOCET/ROXICET) 5-325 MG per tablet Take 1 tablet by mouth every 4 (four) hours as needed for severe pain. 02/01/14   Margarita Grizzle, MD  pantoprazole (PROTONIX) 20 MG tablet Take 1 tablet (20 mg total) by mouth daily. 02/01/14   Margarita Grizzle, MD  predniSONE (STERAPRED UNI-PAK 21 TAB) 10 MG (21) TBPK tablet Take by mouth daily. Take 6 tabs by mouth daily  for 2 days, then 5 tabs for 2 days, then 4 tabs for 2 days, then 3 tabs for 2 days, 2 tabs for 2 days, then 1 tab by mouth daily for 2 days 03/06/18   Garnette Greb, PA-C    Family History No family history on file.  Social History Social History   Tobacco Use  . Smoking status: Current Every Day Smoker    Packs/day: 0.75    Types: Cigarettes  . Smokeless tobacco: Never Used  Substance Use Topics  . Alcohol use: Yes    Alcohol/week: 4.0 standard drinks    Types: 4 Cans of beer per week  . Drug use: No     Allergies  Patient has no known allergies.   Review of Systems Review of Systems  Musculoskeletal: Positive for back pain.  Neurological: Negative for numbness.     Physical Exam Updated Vital Signs BP 132/89 (BP Location: Right Arm)   Pulse 96   Temp 98.8 F (37.1 C) (Oral)   Resp 16   Ht 5\' 7"  (1.702 m)   Wt 79.4 kg   SpO2 97%   BMI 27.41 kg/m   Physical Exam  Constitutional: He is oriented to person, place, and time. He appears well-developed and well-nourished. No distress.  Appears in no distress  HENT:  Head: Normocephalic and atraumatic.  Eyes: EOM are normal.  Neck: Normal range of motion.  Pulmonary/Chest: Effort normal.  Abdominal: He exhibits no distension.  Musculoskeletal: Normal range of motion. He exhibits tenderness.  Tenderness palpation of left low back.  No  increased pain over midline spine.  No tenderness palpation of the right side.  Increased pain with straight leg raise.  Patient is ambulatory.  Sensation of lower extremity is intact.  Pedal pulses intact.  No saddle paresthesias.    Neurological: He is alert and oriented to person, place, and time. No sensory deficit.  Skin: Skin is warm. Capillary refill takes less than 2 seconds. No rash noted.  Psychiatric: He has a normal mood and affect.  Nursing note and vitals reviewed.    ED Treatments / Results  Labs (all labs ordered are listed, but only abnormal results are displayed) Labs Reviewed - No data to display  EKG None  Radiology No results found.  Procedures Procedures (including critical care time)  Medications Ordered in ED Medications - No data to display   Initial Impression / Assessment and Plan / ED Course  I have reviewed the triage vital signs and the nursing notes.  Pertinent labs & imaging results that were available during my care of the patient were reviewed by me and considered in my medical decision making (see chart for details).     Patient presenting for evaluation of left low back pain.  Physical exam reassuring, neurovascularly intact.  No red flags for back pain.  Pain is reproducible with palpation of the musculature and with radiation of the L leg. + SLR. Likely sciatica.  Doubt fracture, I do not believe x-rays will be beneficial.  Doubt vertebral injury, infection, spinal cord compression, myelopathy, or cauda equina syndrome.  Will treat symptomatically with prednisone, muscle relaxers, muscle creams, stretches.  Patient to f/u with his spine doctor as needed.  At this time, patient appears safe for discharge.  Return precautions given.  Patient states he understands agrees plan.   Final Clinical Impressions(s) / ED Diagnoses   Final diagnoses:  Sciatica of left side    ED Discharge Orders         Ordered    cyclobenzaprine (FLEXERIL) 10 MG  tablet  2 times daily PRN     03/06/18 2028    predniSONE (STERAPRED UNI-PAK 21 TAB) 10 MG (21) TBPK tablet  Daily     03/06/18 2028           Alveria Apley, PA-C 03/07/18 0032    Melene Plan, DO 03/07/18 1616

## 2018-03-06 NOTE — Discharge Instructions (Addendum)
Take prednisone as prescribed.  Take the entire course, even if your symptoms improve.  While taking prednisone, avoid anti-inflammatory such as ibuprofen or Aleve.  You may take Tylenol, 1000 milligrams, 3 times a day. Use Flexeril as needed for muscle stiffness or soreness. Have caution, as this may make you tired or groggy. Do not drive or operate heavy machinery while taking this medication.  Use muscle creams (bengay, icy hot, salonpas) as needed for pain.  Use heat or ice, this helps control your symptoms. Try the back stretches described in the paperwork. Follow up with your spine doctor if pain is not improving with this treatment in 2 weeks.  Return to the ER if you develop high fevers, numbness, loss of bowel or bladder control, or any new or concerning symptoms.

## 2018-03-25 ENCOUNTER — Emergency Department (HOSPITAL_COMMUNITY): Payer: Self-pay

## 2018-03-25 ENCOUNTER — Other Ambulatory Visit: Payer: Self-pay

## 2018-03-25 ENCOUNTER — Encounter (HOSPITAL_COMMUNITY): Payer: Self-pay

## 2018-03-25 ENCOUNTER — Emergency Department (HOSPITAL_COMMUNITY)
Admission: EM | Admit: 2018-03-25 | Discharge: 2018-03-25 | Disposition: A | Discharge status: Home / Self Care | Payer: Self-pay | Attending: Emergency Medicine | Admitting: Emergency Medicine

## 2018-03-25 DIAGNOSIS — F17210 Nicotine dependence, cigarettes, uncomplicated: Secondary | ICD-10-CM | POA: Insufficient documentation

## 2018-03-25 DIAGNOSIS — Z7984 Long term (current) use of oral hypoglycemic drugs: Secondary | ICD-10-CM | POA: Insufficient documentation

## 2018-03-25 DIAGNOSIS — E119 Type 2 diabetes mellitus without complications: Secondary | ICD-10-CM | POA: Insufficient documentation

## 2018-03-25 DIAGNOSIS — M5442 Lumbago with sciatica, left side: Secondary | ICD-10-CM | POA: Insufficient documentation

## 2018-03-25 DIAGNOSIS — Z79899 Other long term (current) drug therapy: Secondary | ICD-10-CM | POA: Insufficient documentation

## 2018-03-25 DIAGNOSIS — G8929 Other chronic pain: Secondary | ICD-10-CM

## 2018-03-25 MED ORDER — TRAMADOL HCL 50 MG PO TABS: 50.0000 mg | ORAL_TABLET | Freq: Four times a day (QID) | ORAL | 0 refills | Status: DC | PRN

## 2018-03-25 NOTE — ED Provider Notes (Signed)
[facility] EMERGENCY DEPARTMENT Provider Note   CSN: [id number] Arrival date & time: 03/25/18  4098     History   Chief Complaint Chief Complaint  Patient presents with  . Back Pain    HPI Rondall Radigan is a 42 y.o. male.  HPI 42 year old African-American male past medical history significant for low back pain and diabetes presents to the emergency department today for evaluation of ongoing low back pain.  Pain is been present for the past several weeks.  He has been seen 2 times in the ER for similar symptoms.  States that his pain began after he swallowed some toothpaste and coughed causing some spasm in his lower back.  Denies any new falls or injuries.  Denies any red flag symptoms including loss of bowel or bladder, saddle paresthesias, urinary retention, lower extremity paresthesias, history of cancer, history of IV drug use.  He does report history of sciatic nerve pain, herniated disc and scoliosis that he was seeing a spine doctor for receiving back injections.  Patient states that he did have a MRI 2 years ago.  States that in the past prednisone has worked which she was given at his last ER visit last week.  However he states he continue to work while taking the prednisone and states that this only exacerbated his pain.  Patient states the pain radiates down his left leg.  Pain is worse with movement and long standing or sitting.  Nothing makes better.  He has been taking his prednisone, Flexeril and ibuprofen with little relief of his symptoms.  Denies any urinary symptoms. Past Medical History:  Diagnosis Date  . Back pain    MRI low back-2 discs with nerve impingement  . Diabetes mellitus without complication (HCC)     There are no active problems to display for this patient.   History reviewed. No pertinent surgical history.      Home Medications    Prior to Admission medications   Medication Sig Start Date End Date Taking? Authorizing  Provider  cyclobenzaprine (FLEXERIL) 10 MG tablet Take 1 tablet (10 mg total) by mouth 2 (two) times daily as needed for muscle spasms. 03/06/18   Caccavale, Sophia, PA-C  diclofenac (VOLTAREN) 50 MG EC tablet Take 50 mg by mouth 2 (two) times daily.    [provider]  metFORMIN (GLUCOPHAGE) 500 MG tablet Take 1 tablet (500 mg total) by mouth 2 (two) times daily with a meal. 01/15/16   Allyne Gee, Rosezella Florida, PA-C  ondansetron (ZOFRAN ODT) 8 MG disintegrating tablet Take 1 tablet (8 mg total) by mouth every 8 (eight) hours as needed for nausea or vomiting. 02/01/14   Margarita Grizzle, MD  oxyCODONE-acetaminophen (PERCOCET/ROXICET) 5-325 MG per tablet Take 1 tablet by mouth every 4 (four) hours as needed for severe pain. 02/01/14   Margarita Grizzle, MD  pantoprazole (PROTONIX) 20 MG tablet Take 1 tablet (20 mg total) by mouth daily. 02/01/14   Margarita Grizzle, MD  predniSONE (STERAPRED UNI-PAK 21 TAB) 10 MG (21) TBPK tablet Take by mouth daily. Take 6 tabs by mouth daily  for 2 days, then 5 tabs for 2 days, then 4 tabs for 2 days, then 3 tabs for 2 days, 2 tabs for 2 days, then 1 tab by mouth daily for 2 days 03/06/18   Caccavale, Sophia, PA-C    Family History No family history on file.  Social History Social History   Tobacco Use  . Smoking status: Current Every Day Smoker  Packs/day: 0.75    Types: Cigarettes  . Smokeless tobacco: Never Used  Substance Use Topics  . Alcohol use: Yes    Alcohol/week: 4.0 standard drinks    Types: 4 Cans of beer per week  . Drug use: No     Allergies   Patient has no known allergies.   Review of Systems Review of Systems  Constitutional: Negative for chills and fever.  Genitourinary: Negative for dysuria, flank pain, frequency, hematuria and urgency.  Musculoskeletal: Positive for arthralgias and back pain.  Skin: Negative for rash.  Neurological: Negative for weakness and numbness.     Physical Exam Updated Vital Signs BP (!) 138/97 (BP Location:  Right Arm)   Pulse 79   Temp 98.5 F (36.9 C) (Oral)   Resp 18   Ht 5\' 8"  (1.727 m)   Wt 79.4 kg   SpO2 99%   BMI 26.61 kg/m   Physical Exam  Constitutional: He appears well-developed and well-nourished. No distress.  HENT:  Head: Normocephalic and atraumatic.  Eyes: Right eye exhibits no discharge. Left eye exhibits no discharge. No scleral icterus.  Neck: Normal range of motion.  Pulmonary/Chest: No respiratory distress.  Musculoskeletal: Normal range of motion.  Tenderness palpation of left low back.  No increased pain over midline spine.  No tenderness palpation of the right side.  Increased pain with straight leg raise.  Patient is ambulatory.  Sensation of lower extremity is intact.  Pedal pulses intact.  Does have appearing scoliosis.  Neurological: He is alert.  Skin: Skin is warm and dry. Capillary refill takes less than 2 seconds. No pallor.  Psychiatric: His behavior is normal. Judgment and thought content normal.  Nursing note and vitals reviewed.    ED Treatments / Results  Labs (all labs ordered are listed, but only abnormal results are displayed) Labs Reviewed - No data to display  EKG None  Radiology Dg Lumbar Spine Complete  Result Date: 03/25/2018 CLINICAL DATA:  Chronic low back pain EXAM: LUMBAR SPINE - COMPLETE 4+ VIEW COMPARISON:  None. FINDINGS: There is no evidence of lumbar spine fracture. Alignment is normal. 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 Disc spaces are otherwise maintained. 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 Electronically Signed   By: Elige Ko   On: 03/25/2018 11:50    Procedures Procedures (including critical care time)  Medications Ordered in ED Medications - No data to display   Initial Impression / Assessment and Plan / ED Course  I have reviewed the triage vital signs and the nursing  notes.  Pertinent labs & imaging results that were available during my care of the patient were reviewed by me and considered in my medical decision making (see chart for details).     Patient with back pain.  No neurological deficits and normal neuro exam.  Patient can walk but states is painful.  No loss of bowel or bladder control.  No concern for cauda equina.  No fever, night sweats, weight loss, h/o cancer, IVDU.  Patient has been seen several times in the ED for similar symptoms.  He has tried several medications with only little relief.  I did review patient's MRI from 2 years ago.  Patient x-ray today has a similar appearance with degenerative disease with severe disc height loss of L5-S1.  He has no red flag symptoms or any emergent symptoms that would require emergent MRI at this  time.  Do suspect that patient will need neurosurgery follow-up.  We will give short course of pain medication after reviewing patient's. RICE protocol and pain medicine indicated and discussed with patient.   I did discuss patient with my attending who is agreed with the above plan.   Final Clinical Impressions(s) / ED Diagnoses   Final diagnoses:  Chronic left-sided low back pain with left-sided sciatica    ED Discharge Orders         Ordered    traMADol (ULTRAM) 50 MG tablet  Every 6 hours PRN     03/25/18 1239           Rise MuLeaphart, Breean Nannini T, PA-C 03/25/18 1432    Gerhard MunchLockwood, Robert, MD 03/25/18 [phone]

## 2018-03-25 NOTE — Discharge Instructions (Addendum)
He does have degenerative changes on your x-ray and your L5-S1 area.  Have given a short course of pain medication to take.  I would also use over-the-counter lidocaine patches continue taking your anti-inflammatories.  It is very important for you to follow-up with the neurosurgeon.  Return the ED if you develop any the symptoms as discussed.

## 2018-03-25 NOTE — ED Notes (Signed)
Pt verbalized understanding of discharge instructions and denies any further questions at this time.   

## 2018-03-25 NOTE — ED Triage Notes (Signed)
Patient complains of recurrent lower back pain. Seen here for same in past. Pain with ROM

## 2018-03-25 NOTE — ED Notes (Signed)
ED Provider at bedside. 

## 2018-03-25 NOTE — ED Notes (Signed)
Patient transported to X-ray 

## 2020-07-10 ENCOUNTER — Ambulatory Visit (INDEPENDENT_AMBULATORY_CARE_PROVIDER_SITE_OTHER): Payer: BC Managed Care – PPO

## 2020-07-10 ENCOUNTER — Ambulatory Visit (HOSPITAL_COMMUNITY)
Admission: EM | Admit: 2020-07-10 | Discharge: 2020-07-10 | Disposition: A | Discharge status: Home / Self Care | Payer: BC Managed Care – PPO | Attending: Emergency Medicine | Admitting: Emergency Medicine

## 2020-07-10 ENCOUNTER — Encounter (HOSPITAL_COMMUNITY): Payer: Self-pay | Admitting: Emergency Medicine

## 2020-07-10 DIAGNOSIS — M25512 Pain in left shoulder: Secondary | ICD-10-CM | POA: Diagnosis not present

## 2020-07-10 DIAGNOSIS — W19XXXA Unspecified fall, initial encounter: Secondary | ICD-10-CM | POA: Diagnosis not present

## 2020-07-10 DIAGNOSIS — S40012A Contusion of left shoulder, initial encounter: Secondary | ICD-10-CM | POA: Diagnosis present

## 2020-07-10 DIAGNOSIS — S8991XA Unspecified injury of right lower leg, initial encounter: Secondary | ICD-10-CM | POA: Diagnosis not present

## 2020-07-10 DIAGNOSIS — M25561 Pain in right knee: Secondary | ICD-10-CM | POA: Diagnosis not present

## 2020-07-10 DIAGNOSIS — S82141A Displaced bicondylar fracture of right tibia, initial encounter for closed fracture: Secondary | ICD-10-CM | POA: Diagnosis not present

## 2020-07-10 DIAGNOSIS — M25461 Effusion, right knee: Secondary | ICD-10-CM | POA: Diagnosis not present

## 2020-07-10 MED ORDER — ACETAMINOPHEN 325 MG PO TABS: 650.0000 mg | ORAL_TABLET | Freq: Once | ORAL | Status: DC

## 2020-07-10 MED ORDER — HYDROCODONE-ACETAMINOPHEN 5-325 MG PO TABS: 1.0000 | ORAL_TABLET | Freq: Four times a day (QID) | ORAL | 0 refills | Status: AC | PRN

## 2020-07-10 NOTE — Discharge Instructions (Addendum)
Your left shoulder xray is normal. You have a fracture of right tibial plateu, wear knee immobilizer, rest, elevate, no wt bearing on right leg. Take pain med as directed, do not drink alcohol with pain med. Follow up with Orthopedics call Monday for appt.

## 2020-07-10 NOTE — ED Triage Notes (Signed)
Pt presents with right leg pain and left shoulder pain after falling last night. Thinks he fell off back porch that is approx 5' high from ground.

## 2020-07-10 NOTE — ED Notes (Signed)
Pt left before tylenol was given

## 2020-07-10 NOTE — ED Provider Notes (Signed)
[facility]    CSN: [id number] Arrival date & time: 07/10/20  1443      History   Chief Complaint Chief Complaint  Patient presents with  . Fall  . Leg Pain  . Shoulder Pain    HPI Eugene Myers is a 45 y.o. male.   45 year old American male presents to urgent care with chief complaint of falling yesterday reports alcohol was involved complaining of right knee pain and left shoulder pain, denies LOC.  Patient is able to bear weight and is using personal cane.  Patient has full range of motion of all extremities  The history is provided by the patient. No language interpreter was used.    Past Medical History:  Diagnosis Date  . Back pain    MRI low back-2 discs with nerve impingement  . Diabetes mellitus without complication [facility])     Patient Active Problem List   Diagnosis Date Noted  . Fall 07/10/2020  . Closed fracture of right tibial plateau 07/10/2020  . Contusion of left shoulder 07/10/2020    History reviewed. No pertinent surgical history.     Home Medications    Prior to Admission medications   Medication Sig Start Date End Date Taking? Authorizing Provider  HYDROcodone-acetaminophen (NORCO/VICODIN) 5-325 MG tablet Take 1 tablet by mouth every 6 (six) hours as needed for up to 3 days for severe pain. 07/10/20 07/13/20 Yes Ralyn Stlaurent, Para March, NP  cyclobenzaprine (FLEXERIL) 10 MG tablet Take 1 tablet (10 mg total) by mouth 2 (two) times daily as needed for muscle spasms. 03/06/18   Caccavale, Sophia, PA-C  diclofenac (VOLTAREN) 50 MG EC tablet Take 50 mg by mouth 2 (two) times daily.    [provider]  metFORMIN (GLUCOPHAGE) 500 MG tablet Take 1 tablet (500 mg total) by mouth 2 (two) times daily with a meal. 01/15/16   Allyne Gee, Rosezella Florida, PA-C  ondansetron (ZOFRAN ODT) 8 MG disintegrating tablet Take 1 tablet (8 mg total) by mouth every 8 (eight) hours as needed for nausea or vomiting. 02/01/14   Margarita Grizzle, MD  pantoprazole (PROTONIX) 20 MG  tablet Take 1 tablet (20 mg total) by mouth daily. 02/01/14   Margarita Grizzle, MD  predniSONE (STERAPRED UNI-PAK 21 TAB) 10 MG (21) TBPK tablet Take by mouth daily. Take 6 tabs by mouth daily  for 2 days, then 5 tabs for 2 days, then 4 tabs for 2 days, then 3 tabs for 2 days, 2 tabs for 2 days, then 1 tab by mouth daily for 2 days 03/06/18   Caccavale, Sophia, PA-C    Family History History reviewed. No pertinent family history.  Social History Social History   Tobacco Use  . Smoking status: Current Every Day Smoker    Packs/day: 0.75    Types: Cigarettes  . Smokeless tobacco: Never Used  Vaping Use  . Vaping Use: Never used  Substance Use Topics  . Alcohol use: Yes    Alcohol/week: 4.0 standard drinks    Types: 4 Cans of beer per week  . Drug use: No     Allergies   Patient has no known allergies.   Review of Systems Review of Systems  Constitutional: Negative for activity change, appetite change and fever.  HENT: Negative.   Respiratory: Negative.  Negative for shortness of breath.   Cardiovascular: Negative for chest pain.  Gastrointestinal: Negative for abdominal pain.  Endocrine: Negative.   Genitourinary: Negative for dysuria.  Musculoskeletal: Positive for arthralgias, gait problem, joint swelling and  myalgias.  Skin: Negative for color change and wound.  Allergic/Immunologic: Negative.   Neurological: Negative for headaches.  Hematological: Negative.   Psychiatric/Behavioral: Negative.   All other systems reviewed and are negative.    Physical Exam Triage Vital Signs ED Triage Vitals  Enc Vitals Group     BP 07/10/20 1647 138/85     Pulse Rate 07/10/20 1647 71     Resp 07/10/20 1647 17     Temp 07/10/20 1647 99.6 F (37.6 C)     Temp Source 07/10/20 1647 Oral     SpO2 07/10/20 1647 98 %     Weight --      Height --      Head Circumference --      Peak Flow --      Pain Score 07/10/20 1644 8     Pain Loc --      Pain Edu? --      Excl. in GC? --     No data found.  Updated Vital Signs BP 138/85 (BP Location: Left Arm)   Pulse 71   Temp 99.6 F (37.6 C) (Oral)   Resp 17   SpO2 98%   Visual Acuity Right Eye Distance:   Left Eye Distance:   Bilateral Distance:    Right Eye Near:   Left Eye Near:    Bilateral Near:     Physical Exam Vitals and nursing note reviewed.  Constitutional:      General: He is active. He is not in acute distress.    Appearance: He is well-developed and well-nourished. He is not ill-appearing, toxic-appearing or sickly-appearing.  HENT:     Head: Normocephalic.     Right Ear: Tympanic membrane normal.     Left Ear: Tympanic membrane normal.     Nose: Nose normal.     Mouth/Throat:     Mouth: Mucous membranes are normal.     Pharynx: Uvula midline.  Eyes:     Pupils: Pupils are equal, round, and reactive to light.  Neck:     Trachea: Trachea normal.     Meningeal: Brudzinski's sign and Kernig's sign absent.  Cardiovascular:     Rate and Rhythm: Normal rate and regular rhythm.  Pulmonary:     Effort: Pulmonary effort is normal.     Breath sounds: Normal breath sounds.  Musculoskeletal:     Right shoulder: Pain present.     Left shoulder: Tenderness present. Normal range of motion. Normal pulse.     Cervical back: Normal range of motion. Muscular tenderness present.     Right knee: Swelling, effusion and bony tenderness present. No deformity, erythema or ecchymosis. Normal range of motion. Tenderness present over the medial joint line and MCL.       Legs:     Comments: Held in adduction, full range of motion, radial +2 bilaterally, grips equal  Skin:    General: Skin is warm and dry.     Findings: No rash.  Neurological:     General: No focal deficit present.     Mental Status: He is alert and oriented to person, place, and time.     GCS: GCS eye subscore is 4. GCS verbal subscore is 5. GCS motor subscore is 6.     Cranial Nerves: Cranial nerves are intact.     Sensory: Sensation is  intact.     Gait: Gait abnormal.  Psychiatric:        Mood and Affect: Mood and affect normal.  Speech: Speech normal.        Behavior: Behavior normal.      UC Treatments / Results  Labs (all labs ordered are listed, but only abnormal results are displayed) Labs Reviewed - No data to display  EKG   Radiology DG Shoulder Left  Result Date: 07/10/2020 CLINICAL DATA:  Larey Seat from a porch.  Shoulder pain. EXAM: LEFT SHOULDER - 2+ VIEW COMPARISON:  None. FINDINGS: There is no evidence of fracture or dislocation. There is no evidence of arthropathy or other focal bone abnormality. Soft tissues are unremarkable. IMPRESSION: Negative. Electronically Signed   By: Paulina Fusi M.D.   On: 07/10/2020 17:24   DG Knee Complete 4 Views Right  Result Date: 07/10/2020 CLINICAL DATA:  Fell 5 feet. EXAM: RIGHT KNEE - COMPLETE 4+ VIEW COMPARISON:  None. FINDINGS: Moderate-sized joint effusion. There is a fracture of the medial margin of the medial tibial plateau. No other finding. IMPRESSION: Fracture of the medial margin of the medial tibial plateau. Moderate-sized joint effusion. Electronically Signed   By: Paulina Fusi M.D.   On: 07/10/2020 17:23    Procedures Procedures (including critical care time)  Medications Ordered in UC Medications  acetaminophen (TYLENOL) tablet 650 mg (has no administration in time range)    Initial Impression / Assessment and Plan / UC Course  I have reviewed the triage vital signs and the nursing notes.  Pertinent labs & imaging results that were available during my care of the patient were reviewed by me and considered in my medical decision making (see chart for details).      Final Clinical Impressions(s) / UC Diagnoses   Final diagnoses:  Fall, initial encounter  Closed fracture of right tibial plateau, initial encounter  Contusion of left shoulder, initial encounter     Discharge Instructions     Your left shoulder xray is normal. You have a  fracture of right tibial plateu, wear knee immobilizer, rest, elevate, no wt bearing on right leg. Take pain med as directed, do not drink alcohol with pain med. Follow up with Orthopedics call Monday for appt.     ED Prescriptions    Medication Sig Dispense Auth. Provider   HYDROcodone-acetaminophen (NORCO/VICODIN) 5-325 MG tablet Take 1 tablet by mouth every 6 (six) hours as needed for up to 3 days for severe pain. 10 tablet Bufford Helms, Para March, NP     I have reviewed the PDMP during this encounter.   Clancy Gourd, NP 07/10/20 1904

## 2020-07-13 ENCOUNTER — Telehealth (HOSPITAL_COMMUNITY): Payer: Self-pay | Admitting: *Deleted

## 2020-07-13 NOTE — Telephone Encounter (Signed)
Incorrect orthopedic placed on discharge instructions. Patient has called back this am for guidance. Information given for correct Dr [address] Filter with [facility]. Patient updated they are can go to any orthopedic they feel comfortable going to.

## 2020-07-15 DIAGNOSIS — S82131A Displaced fracture of medial condyle of right tibia, initial encounter for closed fracture: Secondary | ICD-10-CM | POA: Diagnosis not present

## 2020-08-13 DIAGNOSIS — M25561 Pain in right knee: Secondary | ICD-10-CM | POA: Diagnosis not present

## 2020-08-13 DIAGNOSIS — S82111S Displaced fracture of right tibial spine, sequela: Secondary | ICD-10-CM | POA: Diagnosis not present

## 2020-08-24 DIAGNOSIS — S82111S Displaced fracture of right tibial spine, sequela: Secondary | ICD-10-CM | POA: Diagnosis not present

## 2020-09-10 DIAGNOSIS — S82111S Displaced fracture of right tibial spine, sequela: Secondary | ICD-10-CM | POA: Diagnosis not present

## 2020-12-19 ENCOUNTER — Ambulatory Visit (HOSPITAL_COMMUNITY)
Admission: EM | Admit: 2020-12-19 | Discharge: 2020-12-19 | Disposition: A | Discharge status: Home / Self Care | Payer: BC Managed Care – PPO | Attending: Emergency Medicine | Admitting: Emergency Medicine

## 2020-12-19 ENCOUNTER — Inpatient Hospital Stay (HOSPITAL_COMMUNITY)
Admission: EM | Admit: 2020-12-19 | Discharge: 2020-12-22 | DRG: 310 | Disposition: A | Discharge status: Home / Self Care | Payer: BC Managed Care – PPO | Source: Ambulatory Visit | Attending: Family Medicine | Admitting: Family Medicine

## 2020-12-19 ENCOUNTER — Encounter (HOSPITAL_COMMUNITY): Payer: Self-pay

## 2020-12-19 ENCOUNTER — Other Ambulatory Visit: Payer: Self-pay

## 2020-12-19 ENCOUNTER — Encounter (HOSPITAL_COMMUNITY): Payer: Self-pay | Admitting: Emergency Medicine

## 2020-12-19 ENCOUNTER — Emergency Department (HOSPITAL_COMMUNITY): Payer: BC Managed Care – PPO

## 2020-12-19 DIAGNOSIS — Z7984 Long term (current) use of oral hypoglycemic drugs: Secondary | ICD-10-CM

## 2020-12-19 DIAGNOSIS — R001 Bradycardia, unspecified: Secondary | ICD-10-CM | POA: Diagnosis not present

## 2020-12-19 DIAGNOSIS — Z79899 Other long term (current) drug therapy: Secondary | ICD-10-CM | POA: Diagnosis not present

## 2020-12-19 DIAGNOSIS — A084 Viral intestinal infection, unspecified: Secondary | ICD-10-CM | POA: Diagnosis not present

## 2020-12-19 DIAGNOSIS — R1013 Epigastric pain: Secondary | ICD-10-CM

## 2020-12-19 DIAGNOSIS — K5730 Diverticulosis of large intestine without perforation or abscess without bleeding: Secondary | ICD-10-CM | POA: Diagnosis present

## 2020-12-19 DIAGNOSIS — M549 Dorsalgia, unspecified: Secondary | ICD-10-CM | POA: Diagnosis not present

## 2020-12-19 DIAGNOSIS — E119 Type 2 diabetes mellitus without complications: Secondary | ICD-10-CM | POA: Diagnosis present

## 2020-12-19 DIAGNOSIS — R1030 Lower abdominal pain, unspecified: Secondary | ICD-10-CM | POA: Diagnosis not present

## 2020-12-19 DIAGNOSIS — F17210 Nicotine dependence, cigarettes, uncomplicated: Secondary | ICD-10-CM | POA: Diagnosis present

## 2020-12-19 DIAGNOSIS — E785 Hyperlipidemia, unspecified: Secondary | ICD-10-CM | POA: Diagnosis present

## 2020-12-19 DIAGNOSIS — R0602 Shortness of breath: Secondary | ICD-10-CM | POA: Diagnosis not present

## 2020-12-19 DIAGNOSIS — R109 Unspecified abdominal pain: Secondary | ICD-10-CM | POA: Diagnosis not present

## 2020-12-19 DIAGNOSIS — I4510 Unspecified right bundle-branch block: Secondary | ICD-10-CM

## 2020-12-19 DIAGNOSIS — Z20822 Contact with and (suspected) exposure to covid-19: Secondary | ICD-10-CM | POA: Diagnosis not present

## 2020-12-19 DIAGNOSIS — E739 Lactose intolerance, unspecified: Secondary | ICD-10-CM | POA: Diagnosis not present

## 2020-12-19 DIAGNOSIS — Z833 Family history of diabetes mellitus: Secondary | ICD-10-CM

## 2020-12-19 DIAGNOSIS — K760 Fatty (change of) liver, not elsewhere classified: Secondary | ICD-10-CM | POA: Diagnosis not present

## 2020-12-19 DIAGNOSIS — M1990 Unspecified osteoarthritis, unspecified site: Secondary | ICD-10-CM | POA: Diagnosis present

## 2020-12-19 DIAGNOSIS — R112 Nausea with vomiting, unspecified: Secondary | ICD-10-CM | POA: Diagnosis not present

## 2020-12-19 DIAGNOSIS — F1010 Alcohol abuse, uncomplicated: Secondary | ICD-10-CM | POA: Diagnosis not present

## 2020-12-19 DIAGNOSIS — R55 Syncope and collapse: Secondary | ICD-10-CM | POA: Diagnosis not present

## 2020-12-19 DIAGNOSIS — Z8639 Personal history of other endocrine, nutritional and metabolic disease: Secondary | ICD-10-CM | POA: Diagnosis not present

## 2020-12-19 LAB — CBC
HCT: 47.7 % (ref 39.0–52.0)
Hemoglobin: 15.8 g/dL (ref 13.0–17.0)
MCH: 31.6 pg (ref 26.0–34.0)
MCHC: 33.1 g/dL (ref 30.0–36.0)
MCV: 95.4 fL (ref 80.0–100.0)
Platelets: 272 10*3/uL (ref 150–400)
RBC: 5 MIL/uL (ref 4.22–5.81)
RDW: 12.2 % (ref 11.5–15.5)
WBC: 10.1 10*3/uL (ref 4.0–10.5)
nRBC: 0 % (ref 0.0–0.2)

## 2020-12-19 LAB — COMPREHENSIVE METABOLIC PANEL
ALT: 35 U/L (ref 0–44)
AST: 31 U/L (ref 15–41)
Albumin: 4.9 g/dL (ref 3.5–5.0)
Alkaline Phosphatase: 92 U/L (ref 38–126)
Anion gap: 11 (ref 5–15)
BUN: 13 mg/dL (ref 6–20)
CO2: 23 mmol/L (ref 22–32)
Calcium: 9.4 mg/dL (ref 8.9–10.3)
Chloride: 101 mmol/L (ref 98–111)
Creatinine, Ser: 0.83 mg/dL (ref 0.61–1.24)
GFR, Estimated: >60 mL/min (ref >60)
Glucose, Bld: 158 mg/dL — ABNORMAL HIGH (ref 70–99)
Potassium: 3.8 mmol/L (ref 3.5–5.1)
Sodium: 135 mmol/L (ref 135–145)
Total Bilirubin: 1.1 mg/dL (ref 0.3–1.2)
Total Protein: 8 g/dL (ref 6.5–8.1)

## 2020-12-19 LAB — URINALYSIS, ROUTINE W REFLEX MICROSCOPIC
Bilirubin Urine: NEGATIVE
Glucose, UA: NEGATIVE mg/dL
Ketones, ur: NEGATIVE mg/dL
Leukocytes,Ua: NEGATIVE
Nitrite: NEGATIVE
Protein, ur: NEGATIVE mg/dL
Specific Gravity, Urine: >1.03 — ABNORMAL HIGH (ref 1.005–1.030)
pH: 6 (ref 5.0–8.0)

## 2020-12-19 LAB — TROPONIN I (HIGH SENSITIVITY)
Troponin I (High Sensitivity): 4 ng/L (ref <18)
Troponin I (High Sensitivity): 6 ng/L (ref <18)

## 2020-12-19 LAB — URINALYSIS, MICROSCOPIC (REFLEX): Bacteria, UA: NONE SEEN

## 2020-12-19 LAB — RESP PANEL BY RT-PCR (FLU A&B, COVID) ARPGX2
Influenza A by PCR: NEGATIVE
Influenza B by PCR: NEGATIVE
SARS Coronavirus 2 by RT PCR: NEGATIVE

## 2020-12-19 LAB — LIPASE, BLOOD: Lipase: 23 U/L (ref 11–51)

## 2020-12-19 LAB — MAGNESIUM: Magnesium: 1.8 mg/dL (ref 1.7–2.4)

## 2020-12-19 MED ORDER — LACTATED RINGERS IV SOLN: INTRAVENOUS | Status: DC

## 2020-12-19 MED ORDER — ONDANSETRON HCL 4 MG/2ML IJ SOLN
4.0000 mg | Freq: Once | INTRAMUSCULAR | Status: AC
  Administered 2020-12-19: 17:00:00 4 mg via INTRAVENOUS
  Filled 2020-12-19: qty 2

## 2020-12-19 MED ORDER — FAMOTIDINE IN NACL 20-0.9 MG/50ML-% IV SOLN
20.0000 mg | Freq: Once | INTRAVENOUS | Status: AC
  Administered 2020-12-19: 17:00:00 20 mg via INTRAVENOUS
  Filled 2020-12-19: qty 50

## 2020-12-19 MED ORDER — MORPHINE SULFATE (PF) 4 MG/ML IV SOLN
4.0000 mg | Freq: Once | INTRAVENOUS | Status: AC
Start: 2020-12-19 — End: 2020-12-19
  Administered 2020-12-19: 18:00:00 4 mg via INTRAVENOUS
  Filled 2020-12-19: qty 1

## 2020-12-19 MED ORDER — MORPHINE SULFATE (PF) 4 MG/ML IV SOLN
4.0000 mg | Freq: Once | INTRAVENOUS | Status: AC
  Administered 2020-12-19: 17:00:00 4 mg via INTRAVENOUS
  Filled 2020-12-19: qty 1

## 2020-12-19 MED ORDER — SODIUM CHLORIDE 0.9 % IV BOLUS
1000.0000 mL | Freq: Once | INTRAVENOUS | Status: AC
  Administered 2020-12-19: 17:00:00 1000 mL via INTRAVENOUS

## 2020-12-19 MED ORDER — SODIUM CHLORIDE 0.9% FLUSH
3.0000 mL | Freq: Two times a day (BID) | INTRAVENOUS | Status: DC
  Administered 2020-12-19 – 2020-12-20 (×2): 3 mL via INTRAVENOUS

## 2020-12-19 MED ORDER — ENOXAPARIN SODIUM 40 MG/0.4ML IJ SOSY
40.0000 mg | PREFILLED_SYRINGE | INTRAMUSCULAR | Status: DC
  Administered 2020-12-20 – 2020-12-21 (×3): 40 mg via SUBCUTANEOUS
  Filled 2020-12-19 (×4): qty 0.4

## 2020-12-19 NOTE — ED Triage Notes (Signed)
Pt here from urgent care. Pt went there for abd pain, N/V that started this morning, urgent care noticed abnormal ECG , bundle branch block, advised to  come to ER. Pt denies cp, no cardiac hx. Reports shob, 100% on RA.

## 2020-12-19 NOTE — H&P (Signed)
History and Physical   Kamarrion Stfort NLG:[id number] DOB: 12-07-1975 DOA: 12/19/2020  PCP: Georgianne Fick, MD   Patient coming from: Home/urgent care  Chief Complaint: Nausea, vomiting, episodes of lightheadedness  HPI: Eugene Myers is a 45 y.o. male with medical history significant of diabetes and back pain who presents following episodes of lightheadedness and some episodes of nausea and vomiting. Patient states that he woke up this morning with some abdominal pain as well as some nausea and vomiting that was nonbloody and nonbilious.  He reports some intermittent fatigue and some mild shortness of breath. Also with several episodes of lightheadedness. But denies any syncope or loss of consciousness.  He states that he went to a cookout yesterday and drink a good amount while he was there.  He went to be evaluated at urgent care after waking up with some nausea vomiting and abdominal pain. He states he has not had any lightheadedness or fatigue here. Denies chest pain, constipation, diarrhea, chest pain, palpitations.   ED Course: Vital signs in the ED significant for heart rate in the 40s to 50s.  Blood pressure in the 150s to 180s systolic.  Lab work-up showed CMP within normal limits.  CBC with normal limits.  Lipase within normal limits.  Troponin within normal limits x2.  Respiratory panel flu COVID-negative.  Magnesium normal.  Urinalysis normal.  Chest x-ray without acute abnormality.  Patient received some pain medication in the ED and medication for his nausea.  Cardiology was consulted by EDP who will see the patient.  Review of Systems: As per HPI otherwise all other systems reviewed and are negative.  Past Medical History:  Diagnosis Date   Back pain    MRI low back-2 discs with nerve impingement   Diabetes mellitus without complication (HCC)     No past surgical history on file.  Social History  reports that he has been smoking cigarettes. He has a 10.00 pack-year  smoking history. He has never used smokeless tobacco. He reports current alcohol use of about 4.0 standard drinks of alcohol per week. He reports current drug use. Drug: Marijuana.  Not on File  Family History  Problem Relation Age of Onset   Diabetes Mother    Diabetes Father     Prior to Admission medications   Medication Sig Start Date End Date Taking? Authorizing Provider  cyclobenzaprine (FLEXERIL) 10 MG tablet Take 1 tablet (10 mg total) by mouth 2 (two) times daily as needed for muscle spasms. 03/06/18   Caccavale, Sophia, PA-C  diclofenac (VOLTAREN) 50 MG EC tablet Take 50 mg by mouth 2 (two) times daily.    [provider]  metFORMIN (GLUCOPHAGE) 500 MG tablet Take 1 tablet (500 mg total) by mouth 2 (two) times daily with a meal. 01/15/16   Allyne Gee, Rosezella Florida, PA-C  ondansetron (ZOFRAN ODT) 8 MG disintegrating tablet Take 1 tablet (8 mg total) by mouth every 8 (eight) hours as needed for nausea or vomiting. 02/01/14   Margarita Grizzle, MD  pantoprazole (PROTONIX) 20 MG tablet Take 1 tablet (20 mg total) by mouth daily. 02/01/14   Margarita Grizzle, MD  predniSONE (STERAPRED UNI-PAK 21 TAB) 10 MG (21) TBPK tablet Take by mouth daily. Take 6 tabs by mouth daily  for 2 days, then 5 tabs for 2 days, then 4 tabs for 2 days, then 3 tabs for 2 days, 2 tabs for 2 days, then 1 tab by mouth daily for 2 days 03/06/18   Caccavale, Jeanette Caprice, PA-C  Physical Exam: Vitals:   12/19/20 1815 12/19/20 1830 12/19/20 1845 12/19/20 1915  BP: (!) 156/87 (!) 154/99 (!) 166/91 (!) 161/92  Pulse: (!) 52 (!) 47 (!) 53 (!) 48  Resp: 10 18 12 20   Temp:      TempSrc:      SpO2: 99% 99% 97% 97%  Weight:      Height:       Physical Exam Constitutional:      General: He is not in acute distress.    Appearance: Normal appearance.  HENT:     Head: Normocephalic and atraumatic.     Mouth/Throat:     Mouth: Mucous membranes are moist.     Pharynx: Oropharynx is clear.  Eyes:     Extraocular Movements: Extraocular  movements intact.     Pupils: Pupils are equal, round, and reactive to light.  Cardiovascular:     Rate and Rhythm: Regular rhythm. Bradycardia present.     Pulses: Normal pulses.     Heart sounds: Normal heart sounds.  Pulmonary:     Effort: Pulmonary effort is normal. No respiratory distress.     Breath sounds: Normal breath sounds.  Abdominal:     General: Bowel sounds are normal. There is no distension.     Palpations: Abdomen is soft.     Tenderness: There is abdominal tenderness in the periumbilical area.  Musculoskeletal:        General: No swelling or deformity.  Skin:    General: Skin is warm and dry.  Neurological:     General: No focal deficit present.     Mental Status: Mental status is at baseline.   Labs on Admission: I have personally reviewed following labs and imaging studies  CBC: Recent Labs  Lab 12/19/20 1414  WBC 10.1  HGB 15.8  HCT 47.7  MCV 95.4  PLT 272    Basic Metabolic Panel: Recent Labs  Lab 12/19/20 1414 12/19/20 1513  NA 135  --   K 3.8  --   CL 101  --   CO2 23  --   GLUCOSE 158*  --   BUN 13  --   CREATININE 0.83  --   CALCIUM 9.4  --   MG  --  1.8    GFR: Estimated Creatinine Clearance: 107.6 mL/min (by C-G formula based on SCr of 0.83 mg/dL).  Liver Function Tests: Recent Labs  Lab 12/19/20 1414  AST 31  ALT 35  ALKPHOS 92  BILITOT 1.1  PROT 8.0  ALBUMIN 4.9    Urine analysis:    Component Value Date/Time   COLORURINE YELLOW 12/19/2020 1414   APPEARANCEUR CLEAR 12/19/2020 1414   LABSPEC >1.030 (H) 12/19/2020 1414   PHURINE 6.0 12/19/2020 1414   GLUCOSEU NEGATIVE 12/19/2020 1414   HGBUR TRACE (A) 12/19/2020 1414   BILIRUBINUR NEGATIVE 12/19/2020 1414   KETONESUR NEGATIVE 12/19/2020 1414   PROTEINUR NEGATIVE 12/19/2020 1414   UROBILINOGEN 0.2 02/01/2014 1642   NITRITE NEGATIVE 12/19/2020 1414   LEUKOCYTESUR NEGATIVE 12/19/2020 1414    Radiological Exams on Admission: DG Chest 2 View  Result Date:  12/19/2020 CLINICAL DATA:  Shortness of breath.  Abdominal pain. EXAM: CHEST - 2 VIEW COMPARISON:  None. FINDINGS: The heart size and mediastinal contours are within normal limits. Both lungs are clear. The visualized skeletal structures are unremarkable. IMPRESSION: No active cardiopulmonary disease. Electronically Signed   By: 12/21/2020 M.D.   On: 12/19/2020 15:04    EKG: Independently reviewed.  Sinus bradycardia at 49 bpm.  Assessment/Plan Principal Problem:   Symptomatic bradycardia Active Problems:   Abdominal pain   History of diabetes mellitus   Symptomatic bradycardia > Patient with episodes of lightheadedness and fatigue in the past couple days.  None currently.  Found to have heart rate in the 40s to 50s with baseline typically in the 70s. > Cardiology consulted by EDP who will see the patient.  We will monitor on telemetry. - Monitor on telemetry overnight - Not on any nodal blocking agents - We will give maintenance IV fluids considering nausea and vomiting.  Nausea and vomiting Abdominal Pain > No leukocytosis to point towards infectious etiology, possibly viral - Supportive care for now  History of diabetes > Not on any medications, CBG 158 in the ED  DVT prophylaxis: Lovenox  Code Status:   Full  Family Communication:  Mother and significant other updated at bedside Disposition Plan:   Patient is from:  Home  Anticipated DC to:  Home  Anticipated DC date:  1 to 2 days  Anticipated DC barriers: None  Consults called:  Cardiology consulted by EDP  Admission status:  Observation, telemetry   Severity of Illness: The appropriate patient status for this patient is OBSERVATION. Observation status is judged to be reasonable and necessary in order to provide the required intensity of service to ensure the patient's safety. The patient's presenting symptoms, physical exam findings, and initial radiographic and laboratory data in the context of their medical condition  is felt to place them at decreased risk for further clinical deterioration. Furthermore, it is anticipated that the patient will be medically stable for discharge from the hospital within 2 midnights of admission. The following factors support the patient status of observation.   " The patient's presenting symptoms include fatigue, presyncope, bradycardia, abdominal pain, nausea, vomiting. " The physical exam findings include abdominal tenderness, bradycardia. " The initial radiographic and laboratory data are stable, concerning for EKG with heart rate in the upper 40s.   Synetta Fail MD [facility]  How to contact the [facility] Attending or Consulting provider 7A - 7P or covering provider during after hours 7P -7A, for this patient?   Check the care team in [facility] and look for a) attending/consulting [facility] provider listed and b) the [facility] team listed Log into [email or web address] and use [place] universal password to access. If you do not have the password, please contact the hospital operator. Locate the [facility] provider you are looking for under [facility] and page to a number that you can be directly reached. If you still have difficulty reaching the provider, please page the [facility] (Director on Call) for the Hospitalists listed on amion for assistance.  12/19/2020, 8:48 PM

## 2020-12-19 NOTE — ED Notes (Signed)
Report attempted rn to attempt again  

## 2020-12-19 NOTE — Discharge Instructions (Addendum)
Please go to emergency department for cardiac labs given new right bundle branch block identified on EKG with ST segment elevation in V2 and V3.

## 2020-12-19 NOTE — ED Triage Notes (Signed)
Pt c/o vomiting and nausea.  He states he has not been able to sit still.

## 2020-12-19 NOTE — Consult Note (Signed)
Reason for Consult Marked bradycardia Referring Physician: Teaching service  Eugene Myers is an 45 y.o. male.  HPI: Patient is 45 year old male with past medical history significant for type 2 diabetes mellitus, degenerative joint disease, tobacco abuse, EtOH abuse, came to ER from urgent care complaining of periumbilical pain associated with nausea and vomiting which woke him up around 6 AM states pain is localized.  Denies any fever or chills.  Denies diarrhea.  States ate cookout yesterday and has drinking water at home.  Recommending for last 20 years.  Patient denies any chest pain.  Denies shortness of breath.  Denies syncopal episode.  Denies any thyroid problems.  Patient was noted to have marked sinus bradycardia heart rate in 40s with RSR pattern in V1 and V2.  Patient presently heart rate has improved to 50s to 70.  States he has been very active and walks approximately 10 miles every day without any problems at his work.  Past Medical History:  Diagnosis Date   Back pain    MRI low back-2 discs with nerve impingement   Diabetes mellitus without complication (HCC)     No past surgical history on file.  Family History  Problem Relation Age of Onset   Diabetes Mother    Diabetes Father     Social History:  reports that he has been smoking cigarettes. He has a 10.00 pack-year smoking history. He has never used smokeless tobacco. He reports current alcohol use of about 4.0 standard drinks of alcohol per week. He reports current drug use. Drug: Marijuana.  Allergies: Not on File  Medications: I have reviewed the patient's current medications.  Results for orders placed or performed during the hospital encounter of 12/19/20 (from the past 48 hour(s))  Lipase, blood     Status: None   Collection Time: 12/19/20  2:14 PM  Result Value Ref Range   Lipase 23 11 - 51 U/L    Comment: Performed at [facility], [address], [place], [place] [address]  Comprehensive metabolic  panel     Status: Abnormal   Collection Time: 12/19/20  2:14 PM  Result Value Ref Range   Sodium 135 135 - 145 mmol/L   Potassium 3.8 3.5 - 5.1 mmol/L   Chloride 101 98 - 111 mmol/L   CO2 23 22 - 32 mmol/L   Glucose, Bld 158 (H) 70 - 99 mg/dL    Comment: Glucose reference range applies only to samples taken after fasting for at least 8 hours.   BUN 13 6 - 20 mg/dL   Creatinine, Ser 4.07 0.61 - 1.24 mg/dL   Calcium 9.4 8.9 - 68.0 mg/dL   Total Protein 8.0 6.5 - 8.1 g/dL   Albumin 4.9 3.5 - 5.0 g/dL   AST 31 15 - 41 U/L   ALT 35 0 - 44 U/L   Alkaline Phosphatase 92 38 - 126 U/L   Total Bilirubin 1.1 0.3 - 1.2 mg/dL   GFR, Estimated >88 >11 mL/min    Comment: (NOTE) Calculated using the CKD-EPI Creatinine Equation (2021)    Anion gap 11 5 - 15    Comment: Performed at [facility], [address] [place], [place] [address]  CBC     Status: None   Collection Time: 12/19/20  2:14 PM  Result Value Ref Range   WBC 10.1 4.0 - 10.5 K/uL   RBC 5.00 4.22 - 5.81 MIL/uL   Hemoglobin 15.8 13.0 - 17.0 g/dL   HCT  47.7 39.0 - 52.0 %   MCV 95.4 80.0 - 100.0 fL   MCH 31.6 26.0 - 34.0 pg   MCHC 33.1 30.0 - 36.0 g/dL   RDW 08.1 44.8 - 18.5 %   Platelets 272 150 - 400 K/uL   nRBC 0.0 0.0 - 0.2 %    Comment: Performed at [facility], [address], [place], [place] [address]  Urinalysis, Routine w reflex microscopic     Status: Abnormal   Collection Time: 12/19/20  2:14 PM  Result Value Ref Range   Color, Urine YELLOW YELLOW   APPearance CLEAR CLEAR   Specific Gravity, Urine >1.030 (H) 1.005 - 1.030   pH 6.0 5.0 - 8.0   Glucose, UA NEGATIVE NEGATIVE mg/dL   Hgb urine dipstick TRACE (A) NEGATIVE   Bilirubin Urine NEGATIVE NEGATIVE   Ketones, ur NEGATIVE NEGATIVE mg/dL   Protein, ur NEGATIVE NEGATIVE mg/dL   Nitrite NEGATIVE NEGATIVE   Leukocytes,Ua NEGATIVE NEGATIVE    Comment: Performed at [facility], [address] [place], [place] [address]  Troponin I (High  Sensitivity)     Status: None   Collection Time: 12/19/20  2:14 PM  Result Value Ref Range   Troponin I (High Sensitivity) 4 <18 ng/L    Comment: (NOTE) Elevated high sensitivity troponin I (hsTnI) values and significant  changes across serial measurements may suggest ACS but many other  chronic and acute conditions are known to elevate hsTnI results.  Refer to the "Links" section for chest pain algorithms and additional  guidance. Performed at [facility], [address], [place], [place] [address]   Urinalysis, Microscopic (reflex)     Status: None   Collection Time: 12/19/20  2:14 PM  Result Value Ref Range   RBC / HPF 0-5 0 - 5 RBC/hpf   WBC, UA 0-5 0 - 5 WBC/hpf   Bacteria, UA NONE SEEN NONE SEEN   Squamous Epithelial / LPF 0-5 0 - 5   Mucus PRESENT     Comment: Performed at [facility], [address], [place], [place] [address]  Magnesium     Status: None   Collection Time: 12/19/20  3:13 PM  Result Value Ref Range   Magnesium 1.8 1.7 - 2.4 mg/dL    Comment: Performed at [facility], [address] [place], [place] [address]  Troponin I (High Sensitivity)     Status: None   Collection Time: 12/19/20  4:15 PM  Result Value Ref Range   Troponin I (High Sensitivity) 6 <18 ng/L    Comment: (NOTE) Elevated high sensitivity troponin I (hsTnI) values and significant  changes across serial measurements may suggest ACS but many other  chronic and acute conditions are known to elevate hsTnI results.  Refer to the "Links" section for chest pain algorithms and additional  guidance. Performed at [facility], [address], [place], [place] [address]   Resp Panel by RT-PCR (Flu A&B, Covid) Nasopharyngeal Swab     Status: None   Collection Time: 12/19/20  6:41 PM   Specimen: Nasopharyngeal Swab; Nasopharyngeal(NP) swabs in vial transport medium  Result Value Ref Range   SARS Coronavirus 2 by RT PCR NEGATIVE NEGATIVE    Comment: (NOTE) SARS-CoV-2  target nucleic acids are NOT DETECTED.  The SARS-CoV-2 RNA is generally detectable in upper respiratory specimens during the acute phase of infection. The lowest concentration of SARS-CoV-2 viral copies this assay can detect is  138 copies/mL. A negative result does not preclude SARS-Cov-2 infection and should not be used as the sole basis for treatment or other patient management decisions. A negative result may occur with  improper specimen collection/handling, submission of specimen other than nasopharyngeal swab, presence of viral mutation(s) within the areas targeted by this assay, and inadequate number of viral copies(<138 copies/mL). A negative result must be combined with clinical observations, patient history, and epidemiological information. The expected result is Negative.  Fact Sheet for Patients:  BloggerCourse.com  Fact Sheet for Healthcare Providers:  SeriousBroker.it  This test is no t yet approved or cleared by the [place] FDA and  has been authorized for detection and/or diagnosis of SARS-CoV-2 by FDA under an Emergency Use Authorization (EUA). This EUA will remain  in effect (meaning this test can be used) for the duration of the COVID-19 declaration under Section 564(b)(1) of the Act, 21 U.S.C.section 360bbb-3(b)(1), unless the authorization is terminated  or revoked sooner.       Influenza A by PCR NEGATIVE NEGATIVE   Influenza B by PCR NEGATIVE NEGATIVE    Comment: (NOTE) The Xpert Xpress SARS-CoV-2/FLU/RSV plus assay is intended as an aid in the diagnosis of influenza from Nasopharyngeal swab specimens and should not be used as a sole basis for treatment. Nasal washings and aspirates are unacceptable for Xpert Xpress SARS-CoV-2/FLU/RSV testing.  Fact Sheet for Patients: BloggerCourse.com  Fact Sheet for Healthcare Providers: SeriousBroker.it  This  test is not yet approved or cleared by the [place] FDA and has been authorized for detection and/or diagnosis of SARS-CoV-2 by FDA under an Emergency Use Authorization (EUA). This EUA will remain in effect (meaning this test can be used) for the duration of the COVID-19 declaration under Section 564(b)(1) of the Act, 21 U.S.C. section 360bbb-3(b)(1), unless the authorization is terminated or revoked.  Performed at [facility], [address] [place], [place] [address]     DG Chest 2 View  Result Date: 12/19/2020 CLINICAL DATA:  Shortness of breath.  Abdominal pain. EXAM: CHEST - 2 VIEW COMPARISON:  None. FINDINGS: The heart size and mediastinal contours are within normal limits. Both lungs are clear. The visualized skeletal structures are unremarkable. IMPRESSION: No active cardiopulmonary disease. Electronically Signed   By: Annia Belt M.D.   On: 12/19/2020 15:04    Review of Systems  Constitutional:  Negative for chills, diaphoresis and fever.  HENT:  Negative for sore throat.   Eyes:  Negative for visual disturbance.  Respiratory:  Negative for cough, chest tightness and wheezing.   Cardiovascular:  Negative for chest pain, palpitations and leg swelling.  Gastrointestinal:  Positive for abdominal pain, nausea and vomiting. Negative for abdominal distention, blood in stool and diarrhea.  Endocrine: Negative for cold intolerance.  Genitourinary:  Negative for difficulty urinating.  Neurological:  Negative for dizziness, syncope and headaches.  Blood pressure (!) 161/92, pulse (!) 48, temperature 97.9 F (36.6 C), temperature source Oral, resp. rate 20, height 5\' 8"  (1.727 m), weight 72.6 kg, SpO2 97 %. Physical Exam Constitutional:      Appearance: He is well-developed.  HENT:     Head: Normocephalic and atraumatic.  Cardiovascular:     Rate and Rhythm: Bradycardia present.     Heart sounds: Normal heart sounds. No murmur heard.   No gallop.  Pulmonary:      Effort: Pulmonary effort is normal.     Breath sounds: Normal breath sounds.  Abdominal:     General: Abdomen  is flat. Bowel sounds are normal. There is no distension.     Palpations: Abdomen is soft. There is no hepatomegaly or mass.     Tenderness: There is no abdominal tenderness. There is no guarding or rebound. Negative signs include Murphy's sign and Rovsing's sign.  Skin:    General: Skin is warm and dry.  Neurological:     General: No focal deficit present.     Mental Status: He is alert and oriented to person, place, and time.    Assessment/Plan: Marked sinus bradycardia secondary to increased vagal tone Abdominal pain associated with nausea vomiting doubt acute appendicitis Diabetes mellitus Elevated blood pressure reading EtOH abuse Tobacco abuse Plan Repeat EKG in a.m. check lipid panel/TSH Abdominal pain work-up per primary team No indication for pacemaker at this stage Patient discussed at length regarding lifestyle changes compliance with diet medication and smoking and alcohol cessation. May need low-dose ARB and statin  Eugene Myers, Eugene Myers 12/19/2020, 8:52 PM

## 2020-12-19 NOTE — Progress Notes (Signed)
Report received.  This RN in pt room with that pt BiPap off when ED RN attempted to give report.  This RN explained this to ED RN and explained will call back cannot take report at this exact moment.  No other attempts for report received by this RN

## 2020-12-19 NOTE — ED Provider Notes (Signed)
[facility]  ____________________________________________  Time seen: Approximately 1:40 PM  I have reviewed the triage vital signs and the nursing notes.   HISTORY  Chief Complaint Abdominal Pain, Nausea, and Emesis   Historian Patient     HPI Eugene Myers is a 45 y.o. male with a history of diabetes, presents to the urgent care with bradycardia, vomiting and nausea.  Patient states that he is also had some mild shortness of breath.  He denies current chest pain or chest tightness.  Patient denies fever and chills at home.  No rhinorrhea, nasal congestion or nonproductive cough.  He reports that he has had similar abdominal discomfort with nausea in the past.   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 No pertinent surgical history.  Prior to Admission medications   Medication Sig Start Date End Date Taking? Authorizing Provider  cyclobenzaprine (FLEXERIL) 10 MG tablet Take 1 tablet (10 mg total) by mouth 2 (two) times daily as needed for muscle spasms. 03/06/18   Caccavale, Sophia, PA-C  diclofenac (VOLTAREN) 50 MG EC tablet Take 50 mg by mouth 2 (two) times daily.    [provider]  metFORMIN (GLUCOPHAGE) 500 MG tablet Take 1 tablet (500 mg total) by mouth 2 (two) times daily with a meal. 01/15/16   Allyne Gee, Rosezella [place], PA-C  ondansetron (ZOFRAN ODT) 8 MG disintegrating tablet Take 1 tablet (8 mg total) by mouth every 8 (eight) hours as needed for nausea or vomiting. 02/01/14    Margarita Grizzle, MD  pantoprazole (PROTONIX) 20 MG tablet Take 1 tablet (20 mg total) by mouth daily. 02/01/14   Margarita Grizzle, MD  predniSONE (STERAPRED UNI-PAK 21 TAB) 10 MG (21) TBPK tablet Take by mouth daily. Take 6 tabs by mouth daily  for 2 days, then 5 tabs for 2 days, then 4 tabs for 2 days, then 3 tabs for 2 days, 2 tabs for 2 days, then 1 tab by mouth daily for 2 days 03/06/18   Caccavale, Sophia, PA-C    Allergies Patient has no allergy information on record.  No family history on file.  Social History Social History   Tobacco Use   Smoking status: Every Day    Packs/day: 0.75    Pack years: 0.00    Types: Cigarettes   Smokeless tobacco: Never  Vaping Use   Vaping Use: Never used  Substance Use Topics   Alcohol use: Yes    Alcohol/week: 4.0 standard drinks    Types: 4 Cans of beer per week   Drug use: No     Review of Systems  Constitutional: No fever/chills Eyes:  No discharge ENT: No upper respiratory complaints. Respiratory: no cough. No SOB/ use of accessory muscles to breath Gastrointestinal: Patient has nausea and vomiting. Musculoskeletal: Negative for musculoskeletal pain. Skin: Negative for rash, abrasions, lacerations, ecchymosis.    ____________________________________________   PHYSICAL EXAM:  VITAL SIGNS: ED Triage Vitals  Enc Vitals Group     BP 12/19/20 1238 (!) 181/93     Pulse Rate 12/19/20 1238 (!) 43     Resp 12/19/20 1238 18     Temp 12/19/20 1238 98 F (36.7 C)     Temp Source 12/19/20 1238 Oral     SpO2 12/19/20 1238 98 %     Weight --      Height --      Head Circumference --      Peak Flow --      Pain Score 12/19/20 1236 8     Pain Loc --      Pain Edu? --      Excl. in GC? --      Constitutional: Alert and oriented. Well appearing and in no acute distress. Eyes: Conjunctivae are normal. PERRL. EOMI. Head: Atraumatic. ENT:      Ears: TMs are pearly.      Nose: No congestion/rhinnorhea.      Mouth/Throat: Mucous  membranes are moist.  Neck: No stridor.  No cervical spine tenderness to palpation. Cardiovascular: Patient is bradycardic.  Normal S1 and S2.  Good peripheral circulation. Respiratory: Normal respiratory effort without tachypnea or retractions. Lungs CTAB. Good air entry to the bases with no decreased or absent breath sounds Gastrointestinal: Bowel sounds x 4 quadrants. Soft and nontender to palpation. No guarding or rigidity. No distention. Musculoskeletal: Full range of motion to all extremities. No obvious deformities noted Neurologic:  Normal for age. No gross focal neurologic deficits are appreciated.  Skin:  Skin is warm, dry and intact. No rash noted. Psychiatric: Mood and affect are normal for age. Speech and behavior are normal.   ____________________________________________   LABS (all labs ordered are listed, but only abnormal results are displayed)  Labs Reviewed - No data to display ____________________________________________  EKG  Sinus bradycardia at ventricular rate of 42 bpm. EKG shows incomplete right bundle branch block. ST segment elevation noted in V3 and V2. ____________________________________________  RADIOLOGY   No results found.  ____________________________________________    PROCEDURES  Procedure(s) performed:     Procedures     Medications - No data to display   ____________________________________________   INITIAL IMPRESSION / ASSESSMENT AND PLAN / ED COURSE  Pertinent labs & imaging results that were available during my care of the patient were reviewed by me and considered in my medical decision making (see chart for details).      Assessment and plan Nausea Vomiting New right bundle branch block ST segment elevation 45 year old male presents to the urgent care with nausea, vomiting and abdominal discomfort for the past 2 days.  Patient was hypertensive and bradycardic at triage.  Patient looked uncomfortable on exam.   EKG showed new incomplete right bundle branch block with ST segment elevation in V2 and V3.  Will refer patient to the emergency department for cardiac labs and further work-up given these findings.  Patient was wheeled over to the emergency department by nursing staff.     ____________________________________________  FINAL CLINICAL IMPRESSION(S) / ED DIAGNOSES  Final diagnoses:  Right bundle branch block  Nausea and vomiting, intractability of vomiting not specified, unspecified vomiting type      NEW MEDICATIONS STARTED DURING THIS VISIT:  ED Discharge Orders     None           This chart was dictated using voice recognition software/Dragon. Despite best efforts to proofread, errors can occur which can change the meaning. Any change was purely  unintentional.     Orvil Feil, PA-C 12/19/20 1344

## 2020-12-19 NOTE — ED Provider Notes (Signed)
[facility]   CSN: [id number] Arrival date & time: 12/19/20  1343     History Chief Complaint  Patient presents with   Abdominal Pain   Abnormal ECG    Eugene Myers is a 45 y.o. male.  Eugene Myers is a 45 y.o. male with a history of back pain and diabetes, who presents to the emergency department from urgent care for evaluation of vomiting, abdominal pain and bradycardia.  Patient reports that he woke up this morning from sleep with abdominal pain primarily in the epigastric and periumbilical regions.  This pain was associated with nausea and vomiting and patient reports numerous episodes of emesis since waking this morning, emesis is nonbloody and nonbilious.  He denies any associated diarrhea or abnormal bowel movements.  Reports feeling a bit fatigued, but has not had any chest pain or palpitations.  Reported some mild shortness of breath on arrival but now denies this.  He was noted to be bradycardic at urgent care, without known history of this.  Patient reports he was at a cookout yesterday evening and did drink quite a bit of alcohol and at first he just thought he may be hung over this morning but symptoms have been worsening.  He also reports that he had an episode where he got very lightheaded and felt like he was going to pass out but did not lose consciousness.  Reports no longer feeling lightheaded but just feels fatigued.  No fevers or chills.  No other aggravating or alleviating factors.  No known cardiac history.  The history is provided by the patient.      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     No family history on file.  Social History   Tobacco Use   Smoking status: Every Day    Packs/day: 0.50    Years: 20.00    Pack years: 10.00    Types: Cigarettes   Smokeless tobacco: Never  Vaping Use   Vaping Use: Never used  Substance Use Topics   Alcohol use: Yes    Alcohol/week: 4.0 standard drinks    Types: 4 Cans of beer per week   Drug use: Yes    Types: Marijuana    Home Medications Prior to Admission medications   Medication Sig Start Date End Date Taking? Authorizing Provider  cyclobenzaprine (FLEXERIL) 10 MG tablet Take 1 tablet (10 mg total) by mouth 2 (two) times daily as needed for muscle spasms. 03/06/18   Caccavale, Sophia, PA-C  diclofenac (VOLTAREN) 50 MG EC tablet Take 50 mg by mouth 2 (two) times daily.    [provider]  metFORMIN (GLUCOPHAGE) 500 MG tablet Take 1 tablet (500 mg total) by mouth 2 (two) times daily with a meal. 01/15/16   Allyne GeeSanders, Rosezella FloridaLisa M, PA-C  ondansetron (ZOFRAN ODT) 8 MG disintegrating tablet Take 1 tablet (8 mg total) by mouth every 8 (eight) hours as needed for nausea or vomiting. 02/01/14   Margarita Grizzleay, Danielle, MD  pantoprazole (PROTONIX) 20 MG tablet Take 1 tablet (20 mg total) by mouth daily. 02/01/14   Margarita Grizzleay, Danielle, MD  predniSONE (STERAPRED UNI-PAK 21 TAB) 10 MG (21) TBPK tablet Take by mouth daily. Take 6 tabs by mouth daily  for 2 days, then 5 tabs for 2 days, then 4 tabs for 2 days, then 3 tabs for 2 days, 2 tabs for 2 days, then 1 tab by mouth daily for 2 days 03/06/18   Caccavale, Sophia, PA-C    Allergies    Patient has no allergy information on record.  Review of Systems   Review of Systems  Constitutional:  Positive for fatigue. Negative for chills and fever.  Respiratory:  Negative for cough and shortness of breath.   Cardiovascular:  Negative for chest pain.  Gastrointestinal:  Positive for abdominal pain, nausea and vomiting. Negative for blood in stool, constipation and diarrhea.  Genitourinary:  Negative  for dysuria.  Musculoskeletal:  Negative for arthralgias and myalgias.  Skin:  Negative for color change and rash.  Neurological:  Positive for light-headedness. Negative for dizziness, syncope, weakness, numbness and headaches.  All other systems reviewed and are negative.  Physical Exam Updated Vital Signs BP (!) 183/97 (BP Location: Right Arm)   Pulse (!) 44   Temp 97.9 F (36.6 C) (Oral)   Resp (!) 22   Ht 5\' 8"  (1.727 m)   Wt 72.6 kg   SpO2 100%   BMI 24.33 kg/m   Physical Exam Vitals and nursing note reviewed.  Constitutional:      General: He is not in acute distress.    Appearance: Normal appearance. He is well-developed. He is not ill-appearing or diaphoretic.  HENT:     Head: Normocephalic and atraumatic.     Mouth/Throat:     Mouth: Mucous membranes are moist.     Pharynx: Oropharynx is clear.  Eyes:     General:        Right eye: No discharge.        Left eye: No discharge.     Pupils: Pupils are equal, round, and reactive to light.  Cardiovascular:     Rate and Rhythm: Regular rhythm. Bradycardia present.     Pulses: Normal pulses.     Heart sounds: Normal heart sounds.     Comments: Bradycardia with regular rhythm Pulmonary:     Effort: Pulmonary effort is normal. No respiratory distress.     Breath sounds: Normal breath sounds. No wheezing or rales.     Comments: Respirations equal and unlabored, patient able to speak in full sentences, lungs clear to auscultation bilaterally  Abdominal:     General: Bowel sounds are normal. There is no distension.     Palpations: Abdomen is soft. There is no mass.     Tenderness: There is abdominal tenderness. There is no guarding.     Comments: Abdomen soft, nondistended, bowel sounds present throughout, tenderness primarily in the epigastric region with some mild periumbilical tenderness as well, no guarding or rebound tenderness  Musculoskeletal:        General: No deformity.     Cervical back: Neck supple.      Right lower leg: No edema.     Left lower leg: No edema.  Skin:    General: Skin is warm and dry.     Capillary Refill: Capillary refill takes less than 2 seconds.  Neurological:     Mental Status: He is alert and oriented to person, place, and time.     Coordination: Coordination normal.     Comments: Speech is clear, able to follow commands Moves extremities without  ataxia, coordination intact  Psychiatric:        Mood and Affect: Mood normal.        Behavior: Behavior normal.    ED Results / Procedures / Treatments   Labs (all labs ordered are listed, but only abnormal results are displayed) Labs Reviewed  URINALYSIS, ROUTINE W REFLEX MICROSCOPIC - Abnormal; Notable for the following components:      Result Value   Specific Gravity, Urine >1.030 (*)    Hgb urine dipstick TRACE (*)    All other components within normal limits  CBC  URINALYSIS, MICROSCOPIC (REFLEX)  LIPASE, BLOOD  COMPREHENSIVE METABOLIC PANEL  MAGNESIUM  TROPONIN I (HIGH SENSITIVITY)    EKG EKG Interpretation  Date/Time:  Sunday December 19 2020 14:19:03 EDT Ventricular Rate:  44 PR Interval:  170 QRS Duration: 96 QT Interval:  472 QTC Calculation: 403 R Axis:   72 Text Interpretation: Marked sinus bradycardia Incomplete right bundle branch block Abnormal ECG since last tracing no significant change Confirmed by Miller, Brian ([id number]) on 12/19/2020 3:03:35 PM  Radiology DG Chest 2 View  Result Date: 12/19/2020 CLINICAL DATA:  Shortness of breath.  Abdominal pain. EXAM: CHEST - 2 VIEW COMPARISON:  None. FINDINGS: The heart size and mediastinal contours are within normal limits. Both lungs are clear. The visualized skeletal structures are unremarkable. IMPRESSION: No active cardiopulmonary disease. Electronically Signed   By: Drew  Davis M.D.   On: 12/19/2020 15:04    Procedures Procedures   Medications Ordered in ED Medications  sodium chloride 0.9 % bolus 1,000 mL (has no administration in time  range)  ondansetron (ZOFRAN) injection 4 mg (has no administration in time range)  famotidine (PEPCID) IVPB 20 mg premix (has no administration in time range)    ED Course  I have reviewed the triage vital signs and the nursing notes.  Pertinent labs & imaging results that were available during my care of the patient were reviewed by me and considered in my medical decision making (see chart for details).    MDM Rules/Calculators/A&P                         45  year old male presents from urgent care for evaluation of abdominal pain, vomiting and bradycardia.  Per chart review patient's heart rate is typically in the 70s on prior visits, but heart rate has persistently been in the 40s.  Patient woke up this morning with abdominal pain and vomiting.  Has epigastric tenderness and periumbilical tenderness on exam that is mild without guarding, do not feel patient needs abdominal imaging at this time.  Will check basic labs, lipase, urinalysis, troponins, EKG and chest x-ray.  We will continue to closely monitor bradycardia.  Despite bradycardia and hypertension patient is overall well-appearing, appears uncomfortable but in no acute distress.  No active vomiting.  I have independently ordered, reviewed and interpreted all labs and imaging: CBC: No leukocytosis, normal hemoglobin CMP: Glucose 158, no other electrolyte derangements to contribute to bradycardia, normal renal and liver function Mag: WNL Troponin: Negative x2 Lipase: WNL UA: No evidence of infection COVID/flu: Negative  EKG: Sinus bradycardia with no ischemic changes  CXR: No active cardiopulmonary disease.  Abdominal pain and vomiting treated with IV fluids, pain medication, Zofran and Pepcid.  Unclear etiology of bradycardia at this time, could be related to increased vagal tone in the setting of abdominal pain and vomiting, will treat pain and see if this improves.  Patient without prior cardiac  history.  No associated  chest pain.  After 2 doses of pain medication, pain is improving.  Patient remains bradycardic with heart rates in the 40s.  He had near syncopal episode earlier today.  Case discussed with Dr. Ginette Otto with [facility] hospitalist who will see and admit the patient.  Request cardiology consult.  Case discussed with Dr. Sharyn Lull who will come and see the patient this evening, no further recommendations at this time.  Final Clinical Impression(s) / ED Diagnoses Final diagnoses:  Bradycardia  Non-intractable vomiting with nausea, unspecified vomiting type  Epigastric pain  Near syncope    Rx / DC Orders ED Discharge Orders     None        Legrand Rams 12/19/20 2247    Pricilla Loveless, MD 12/20/20 [phone]

## 2020-12-19 NOTE — ED Notes (Signed)
Pt is ambulatory to the bathroom.

## 2020-12-19 NOTE — ED Notes (Signed)
Patient is being discharged from the Urgent Care and sent to the Emergency Department via POV . Per Pia Mau PA, patient is in need of higher level of care due to changes in EKG and bradycardia. Patient is aware and verbalizes understanding of plan of care.  Vitals:   12/19/20 1238 12/19/20 1242  BP: (!) 181/93   Pulse: (!) 43 (!) 43  Resp: 18   Temp: 98 F (36.7 C)   SpO2: 98%

## 2020-12-19 NOTE — ED Notes (Signed)
Reported HR and BP to Medical Providers.

## 2020-12-20 ENCOUNTER — Encounter (HOSPITAL_COMMUNITY): Payer: Self-pay | Admitting: Internal Medicine

## 2020-12-20 DIAGNOSIS — R001 Bradycardia, unspecified: Secondary | ICD-10-CM | POA: Diagnosis not present

## 2020-12-20 LAB — LIPID PANEL
Cholesterol: 164 mg/dL (ref 0–200)
HDL: 49 mg/dL (ref >40)
LDL Cholesterol: 88 mg/dL (ref 0–99)
Total CHOL/HDL Ratio: 3.3 RATIO
Triglycerides: 136 mg/dL (ref <150)
VLDL: 27 mg/dL (ref 0–40)

## 2020-12-20 LAB — CBC
HCT: 41.4 % (ref 39.0–52.0)
Hemoglobin: 13.9 g/dL (ref 13.0–17.0)
MCH: 31.6 pg (ref 26.0–34.0)
MCHC: 33.6 g/dL (ref 30.0–36.0)
MCV: 94.1 fL (ref 80.0–100.0)
Platelets: 250 10*3/uL (ref 150–400)
RBC: 4.4 MIL/uL (ref 4.22–5.81)
RDW: 12.4 % (ref 11.5–15.5)
WBC: 11.5 10*3/uL — ABNORMAL HIGH (ref 4.0–10.5)
nRBC: 0 % (ref 0.0–0.2)

## 2020-12-20 LAB — GLUCOSE, CAPILLARY
Glucose-Capillary: 132 mg/dL — ABNORMAL HIGH (ref 70–99)
Glucose-Capillary: 125 mg/dL — ABNORMAL HIGH (ref 70–99)
Glucose-Capillary: 152 mg/dL — ABNORMAL HIGH (ref 70–99)
Glucose-Capillary: 124 mg/dL — ABNORMAL HIGH (ref 70–99)
Glucose-Capillary: 100 mg/dL — ABNORMAL HIGH (ref 70–99)

## 2020-12-20 LAB — COMPREHENSIVE METABOLIC PANEL
ALT: 25 U/L (ref 0–44)
AST: 22 U/L (ref 15–41)
Albumin: 3.7 g/dL (ref 3.5–5.0)
Alkaline Phosphatase: 70 U/L (ref 38–126)
Anion gap: 9 (ref 5–15)
BUN: 9 mg/dL (ref 6–20)
CO2: 26 mmol/L (ref 22–32)
Calcium: 8.9 mg/dL (ref 8.9–10.3)
Chloride: 102 mmol/L (ref 98–111)
Creatinine, Ser: 1.03 mg/dL (ref 0.61–1.24)
GFR, Estimated: >60 mL/min (ref >60)
Glucose, Bld: 105 mg/dL — ABNORMAL HIGH (ref 70–99)
Potassium: 3.7 mmol/L (ref 3.5–5.1)
Sodium: 137 mmol/L (ref 135–145)
Total Bilirubin: 1.2 mg/dL (ref 0.3–1.2)
Total Protein: 6.3 g/dL — ABNORMAL LOW (ref 6.5–8.1)

## 2020-12-20 LAB — HIV ANTIBODY (ROUTINE TESTING W REFLEX): HIV Screen 4th Generation wRfx: NONREACTIVE

## 2020-12-20 LAB — TSH: TSH: 0.673 u[IU]/mL (ref 0.350–4.500)

## 2020-12-20 MED ORDER — MORPHINE SULFATE (PF) 4 MG/ML IV SOLN
3.0000 mg | INTRAVENOUS | Status: AC | PRN
Start: 2020-12-20 — End: 2020-12-20
  Administered 2020-12-20 (×2): 3 mg via INTRAVENOUS
  Filled 2020-12-20 (×2): qty 1

## 2020-12-20 MED ORDER — ALUM & MAG HYDROXIDE-SIMETH 200-200-20 MG/5ML PO SUSP
30.0000 mL | Freq: Four times a day (QID) | ORAL | Status: DC | PRN
  Administered 2020-12-20 – 2020-12-21 (×3): 30 mL via ORAL
  Filled 2020-12-20 (×3): qty 30

## 2020-12-20 MED ORDER — ACETAMINOPHEN 325 MG PO TABS
650.0000 mg | ORAL_TABLET | Freq: Four times a day (QID) | ORAL | Status: DC | PRN
  Administered 2020-12-20 – 2020-12-21 (×4): 650 mg via ORAL
  Filled 2020-12-20 (×4): qty 2

## 2020-12-20 MED ORDER — ATORVASTATIN CALCIUM 10 MG PO TABS
10.0000 mg | ORAL_TABLET | Freq: Every day | ORAL | Status: DC
  Administered 2020-12-20 – 2020-12-22 (×3): 10 mg via ORAL
  Filled 2020-12-20 (×3): qty 1

## 2020-12-20 MED ORDER — ONDANSETRON HCL 4 MG/2ML IJ SOLN
4.0000 mg | Freq: Four times a day (QID) | INTRAMUSCULAR | Status: DC | PRN
  Administered 2020-12-20: 4 mg via INTRAVENOUS
  Filled 2020-12-20: qty 2

## 2020-12-20 NOTE — Consult Note (Signed)
Subjective:  Patient denies any chest pain or shortness of breath.  Denies dizziness, lightheadedness.  Remains sinus bradycardia on the monitor, asymptomatic.  Objective:  Vital Signs in the last 24 hours: Temp:  [97.9 F (36.6 C)-99.2 F (37.3 C)] 98.8 F (37.1 C) (06/20 0510) Pulse Rate:  [43-58] 51 (06/20 0510) Resp:  [9-22] 16 (06/20 0510) BP: (100-183)/(69-108) 100/71 (06/20 0510) SpO2:  [95 %-100 %] 99 % (06/20 0510) Weight:  [72.4 kg-74.3 kg] 74.3 kg (06/20 0510)  Intake/Output from previous day: No intake/output data recorded. Intake/Output from this shift: No intake/output data recorded.  Physical Exam: Exam unchanged  Lab Results: Recent Labs    12/19/20 1414 12/20/20 0525  WBC 10.1 11.5*  HGB 15.8 13.9  PLT 272 250   Recent Labs    12/19/20 1414 12/20/20 0525  NA 135 137  K 3.8 3.7  CL 101 102  CO2 23 26  GLUCOSE 158* 105*  BUN 13 9  CREATININE 0.83 1.03   No results for input(s): TROPONINI in the last 72 hours.  Invalid input(s): CK, MB Hepatic Function Panel Recent Labs    12/20/20 0525  PROT 6.3*  ALBUMIN 3.7  AST 22  ALT 25  ALKPHOS 70  BILITOT 1.2   Recent Labs    12/20/20 0525  CHOL 164   No results for input(s): PROTIME in the last 72 hours.  Imaging: Imaging results have been reviewed  Cardiac Studies:  Assessment/Plan:  Sinus bradycardia, asymptomatic. Diabetes mellitus. EtOH abuse. Tobacco abuse. Hyperlipidemia. Plan Consider adding low-dose statin and arms.  If blood pressure tolerates. I will sign off.  Please call if needed.  Follow up with me in 2 weeks/when necessary. No further indication for cardiac workup at this point  LOS: 0 days    Rinaldo Cloud 12/20/2020, 10:14 AM

## 2020-12-20 NOTE — Progress Notes (Addendum)
PROGRESS NOTE    Eugene Myers  NWG:[id number] DOB: 1975-07-29 DOA: 12/19/2020 PCP: Georgianne Fick, MD   Brief Narrative:  This 45 years old male with PMH significant for diabetes and back pain presents in the ED with multiple episodes of lightheadedness associated with nausea and vomiting.  He denies any syncope or loss of consciousness.  Patient reports he went to a cookout yesterday and drank a good amount of alcohol while he was there.  He was found to be bradycardic in the ED with heart rate in 40s to 50s, troponins normal. Chest x-ray no acute abnormality.  Cardiology was consulted for significant bradycardia with dizziness.  Assessment & Plan:   Principal Problem:   Symptomatic bradycardia Active Problems:   Abdominal pain   History of diabetes mellitus  Symptomatic bradycardia : Patient presented with multiple episodes of lightheadedness and fatigue in the past couple days.  None currently.   He was found to have heart rate in the 40s to 50s with baseline typically in the 70s. Cardiology was consulted, recommended to avoid AV nodal blocking medications. Continue to monitor on telemetry overnight Not on any nodal blocking agents Continue IV maintenance fluids given nausea and vomiting.   Nausea and vomiting /Abdominal Pain There is no leukocytosis to point towards infectious etiology, possibly viral Continue supportive care for now. Continue IV Zofran.   History of diabetes Not on any medications, CBG 158 in the ED.    DVT prophylaxis: Lovenox. Code Status: Full code. Family Communication:  No family at bed side. Disposition Plan:  Status is: Observation  The patient remains OBS appropriate and will d/c before 2 midnights.  Dispo: The patient is from: Home              Anticipated d/c is to: Home on 6/21              Patient currently is not medically stable to d/c.   Difficult to place patient No   Consultants:  Cardiology  Procedures:   None Antimicrobials:   Anti-infectives (From admission, onward)    None       Subjective: Patient was seen and examined at bedside.  Overnight events noted.  He reports feeling better but still complains of having abdominal pain after he eats food, he is not able to get any food down.  Objective: Vitals:   12/19/20 2226 12/19/20 2244 12/20/20 0510 12/20/20 1353  BP:  110/71 100/71 (!) 165/85  Pulse:  (!) 58 (!) 51 (!) 42  Resp:  18 16   Temp: 98 F (36.7 C) 99.2 F (37.3 C) 98.8 F (37.1 C) 98.8 F (37.1 C)  TempSrc: Oral  Oral Oral  SpO2:   99% 99%  Weight:  72.4 kg 74.3 kg   Height:  5\' 8"  (1.727 m)     No intake or output data in the 24 hours ending 12/20/20 1400 Filed Weights   12/19/20 1412 12/19/20 2244 12/20/20 0510  Weight: 72.6 kg 72.4 kg 74.3 kg    Examination:  General exam: Appears calm and comfortable, not in any acute distress Respiratory system: Clear to auscultation. Respiratory effort normal. Cardiovascular system: S1 & S2 heard, RRR. No JVD, murmurs, rubs, gallops or clicks. No pedal edema. Gastrointestinal system: Abdomen is nondistended, soft and nontender. No organomegaly or masses felt. Normal bowel sounds heard. Central nervous system: Alert and oriented. No focal neurological deficits. Extremities: Symmetric 5 x 5 power.  No edema, no cyanosis, no clubbing. Skin: No rashes, lesions  or ulcers Psychiatry: Judgement and insight appear normal. Mood & affect appropriate.     Data Reviewed: I have personally reviewed following labs and imaging studies  CBC: Recent Labs  Lab 12/19/20 1414 12/20/20 0525  WBC 10.1 11.5*  HGB 15.8 13.9  HCT 47.7 41.4  MCV 95.4 94.1  PLT 272 250   Basic Metabolic Panel: Recent Labs  Lab 12/19/20 1414 12/19/20 1513 12/20/20 0525  NA 135  --  137  K 3.8  --  3.7  CL 101  --  102  CO2 23  --  26  GLUCOSE 158*  --  105*  BUN 13  --  9  CREATININE 0.83  --  1.03  CALCIUM 9.4  --  8.9  MG  --  1.8  --     GFR: Estimated Creatinine Clearance: 86.7 mL/min (by C-G formula based on SCr of 1.03 mg/dL). Liver Function Tests: Recent Labs  Lab 12/19/20 1414 12/20/20 0525  AST 31 22  ALT 35 25  ALKPHOS 92 70  BILITOT 1.1 1.2  PROT 8.0 6.3*  ALBUMIN 4.9 3.7   Recent Labs  Lab 12/19/20 1414  LIPASE 23   No results for input(s): AMMONIA in the last 168 hours. Coagulation Profile: No results for input(s): INR, PROTIME in the last 168 hours. Cardiac Enzymes: No results for input(s): CKTOTAL, CKMB, CKMBINDEX, TROPONINI in the last 168 hours. BNP (last 3 results) No results for input(s): PROBNP in the last 8760 hours. HbA1C: No results for input(s): HGBA1C in the last 72 hours. CBG: Recent Labs  Lab 12/19/20 2250 12/20/20 0753 12/20/20 1149  GLUCAP 132* 125* 152*   Lipid Profile: Recent Labs    12/20/20 0525  CHOL 164  HDL 49  LDLCALC 88  TRIG 136  CHOLHDL 3.3   Thyroid Function Tests: Recent Labs    12/20/20 0525  TSH 0.673   Anemia Panel: No results for input(s): VITAMINB12, FOLATE, FERRITIN, TIBC, IRON, RETICCTPCT in the last 72 hours. Sepsis Labs: No results for input(s): PROCALCITON, LATICACIDVEN in the last 168 hours.  Recent Results (from the past 240 hour(s))  Resp Panel by RT-PCR (Flu A&B, Covid) Nasopharyngeal Swab     Status: None   Collection Time: 12/19/20  6:41 PM   Specimen: Nasopharyngeal Swab; Nasopharyngeal(NP) swabs in vial transport medium  Result Value Ref Range Status   SARS Coronavirus 2 by RT PCR NEGATIVE NEGATIVE Final    Comment: (NOTE) SARS-CoV-2 target nucleic acids are NOT DETECTED.  The SARS-CoV-2 RNA is generally detectable in upper respiratory specimens during the acute phase of infection. The lowest concentration of SARS-CoV-2 viral copies this assay can detect is 138 copies/mL. A negative result does not preclude SARS-Cov-2 infection and should not be used as the sole basis for treatment or other patient management decisions.  A negative result may occur with  improper specimen collection/handling, submission of specimen other than nasopharyngeal swab, presence of viral mutation(s) within the areas targeted by this assay, and inadequate number of viral copies(<138 copies/mL). A negative result must be combined with clinical observations, patient history, and epidemiological information. The expected result is Negative.  Fact Sheet for Patients:  BloggerCourse.com  Fact Sheet for Healthcare Providers:  SeriousBroker.it  This test is no t yet approved or cleared by the [place] FDA and  has been authorized for detection and/or diagnosis of SARS-CoV-2 by FDA under an Emergency Use Authorization (EUA). This EUA will remain  in effect (meaning this test can be used) for  the duration of the COVID-19 declaration under Section 564(b)(1) of the Act, 21 U.S.C.section 360bbb-3(b)(1), unless the authorization is terminated  or revoked sooner.       Influenza A by PCR NEGATIVE NEGATIVE Final   Influenza B by PCR NEGATIVE NEGATIVE Final    Comment: (NOTE) The Xpert Xpress SARS-CoV-2/FLU/RSV plus assay is intended as an aid in the diagnosis of influenza from Nasopharyngeal swab specimens and should not be used as a sole basis for treatment. Nasal washings and aspirates are unacceptable for Xpert Xpress SARS-CoV-2/FLU/RSV testing.  Fact Sheet for Patients: BloggerCourse.com  Fact Sheet for Healthcare Providers: SeriousBroker.it  This test is not yet approved or cleared by the [place] FDA and has been authorized for detection and/or diagnosis of SARS-CoV-2 by FDA under an Emergency Use Authorization (EUA). This EUA will remain in effect (meaning this test can be used) for the duration of the COVID-19 declaration under Section 564(b)(1) of the Act, 21 U.S.C. section 360bbb-3(b)(1), unless the authorization  is terminated or revoked.  Performed at [facility], [address], [place], [place] [address]     Radiology Studies: DG Chest 2 View  Result Date: 12/19/2020 CLINICAL DATA:  Shortness of breath.  Abdominal pain. EXAM: CHEST - 2 VIEW COMPARISON:  None. FINDINGS: The heart size and mediastinal contours are within normal limits. Both lungs are clear. The visualized skeletal structures are unremarkable. IMPRESSION: No active cardiopulmonary disease. Electronically Signed   By: Annia Belt M.D.   On: 12/19/2020 15:04    Scheduled Meds:  atorvastatin  10 mg Oral Daily   enoxaparin (LOVENOX) injection  40 mg Subcutaneous Q24H   sodium chloride flush  3 mL Intravenous Q12H   Continuous Infusions:  lactated ringers 125 mL/hr at 12/20/20 1327     LOS: 0 days    Time spent: 35 mins    Callan Yontz, MD [facility]   If 7PM-7AM, please contact night-coverage

## 2020-12-21 ENCOUNTER — Observation Stay (HOSPITAL_COMMUNITY): Payer: BC Managed Care – PPO

## 2020-12-21 DIAGNOSIS — F1010 Alcohol abuse, uncomplicated: Secondary | ICD-10-CM | POA: Diagnosis present

## 2020-12-21 DIAGNOSIS — A084 Viral intestinal infection, unspecified: Secondary | ICD-10-CM | POA: Diagnosis present

## 2020-12-21 DIAGNOSIS — Z7984 Long term (current) use of oral hypoglycemic drugs: Secondary | ICD-10-CM | POA: Diagnosis not present

## 2020-12-21 DIAGNOSIS — Z79899 Other long term (current) drug therapy: Secondary | ICD-10-CM | POA: Diagnosis not present

## 2020-12-21 DIAGNOSIS — Z20822 Contact with and (suspected) exposure to covid-19: Secondary | ICD-10-CM | POA: Diagnosis present

## 2020-12-21 DIAGNOSIS — M549 Dorsalgia, unspecified: Secondary | ICD-10-CM | POA: Diagnosis present

## 2020-12-21 DIAGNOSIS — R001 Bradycardia, unspecified: Secondary | ICD-10-CM | POA: Diagnosis present

## 2020-12-21 DIAGNOSIS — K760 Fatty (change of) liver, not elsewhere classified: Secondary | ICD-10-CM | POA: Diagnosis not present

## 2020-12-21 DIAGNOSIS — F17210 Nicotine dependence, cigarettes, uncomplicated: Secondary | ICD-10-CM | POA: Diagnosis present

## 2020-12-21 DIAGNOSIS — Z8639 Personal history of other endocrine, nutritional and metabolic disease: Secondary | ICD-10-CM | POA: Diagnosis not present

## 2020-12-21 DIAGNOSIS — E739 Lactose intolerance, unspecified: Secondary | ICD-10-CM | POA: Diagnosis present

## 2020-12-21 DIAGNOSIS — M1990 Unspecified osteoarthritis, unspecified site: Secondary | ICD-10-CM | POA: Diagnosis present

## 2020-12-21 DIAGNOSIS — R109 Unspecified abdominal pain: Secondary | ICD-10-CM | POA: Diagnosis not present

## 2020-12-21 DIAGNOSIS — R1030 Lower abdominal pain, unspecified: Secondary | ICD-10-CM | POA: Diagnosis not present

## 2020-12-21 DIAGNOSIS — E785 Hyperlipidemia, unspecified: Secondary | ICD-10-CM | POA: Diagnosis present

## 2020-12-21 DIAGNOSIS — E119 Type 2 diabetes mellitus without complications: Secondary | ICD-10-CM | POA: Diagnosis present

## 2020-12-21 DIAGNOSIS — Z833 Family history of diabetes mellitus: Secondary | ICD-10-CM | POA: Diagnosis not present

## 2020-12-21 DIAGNOSIS — K5730 Diverticulosis of large intestine without perforation or abscess without bleeding: Secondary | ICD-10-CM | POA: Diagnosis present

## 2020-12-21 LAB — GLUCOSE, CAPILLARY
Glucose-Capillary: 112 mg/dL — ABNORMAL HIGH (ref 70–99)
Glucose-Capillary: 120 mg/dL — ABNORMAL HIGH (ref 70–99)
Glucose-Capillary: 151 mg/dL — ABNORMAL HIGH (ref 70–99)
Glucose-Capillary: 105 mg/dL — ABNORMAL HIGH (ref 70–99)

## 2020-12-21 LAB — LIPASE, BLOOD: Lipase: 25 U/L (ref 11–51)

## 2020-12-21 MED ORDER — IOHEXOL 9 MG/ML PO SOLN
ORAL | Status: AC
  Administered 2020-12-21: 500 mL
  Filled 2020-12-21: qty 1000

## 2020-12-21 MED ORDER — HYDRALAZINE HCL 25 MG PO TABS
25.0000 mg | ORAL_TABLET | Freq: Four times a day (QID) | ORAL | Status: DC | PRN
  Administered 2020-12-21: 25 mg via ORAL
  Filled 2020-12-21: qty 1

## 2020-12-21 MED ORDER — IOHEXOL 300 MG/ML  SOLN
100.0000 mL | Freq: Once | INTRAMUSCULAR | Status: AC | PRN
  Administered 2020-12-21: 100 mL via INTRAVENOUS

## 2020-12-21 NOTE — Progress Notes (Signed)
PROGRESS NOTE    Eugene Myers  ZES:[id number] DOB: 02-08-1976 DOA: 12/19/2020 PCP: Georgianne Fick, MD   Brief Narrative:  This 45 years old male with PMH significant for diabetes and back pain presents in the ED with multiple episodes of lightheadedness associated with nausea and vomiting.  He denies any syncope or loss of consciousness.  Patient reports he went to a cookout yesterday and drank a good amount of alcohol while he was there.  He was found to be bradycardic in the ED with heart rate in 40s to 50s, troponins normal. Chest x-ray no acute abnormality.  Cardiology was consulted for significant bradycardia with dizziness.  No further indication for any cardiac work-up at this time, cardiology signed off.  Assessment & Plan:   Principal Problem:   Symptomatic bradycardia Active Problems:   Abdominal pain   History of diabetes mellitus  Symptomatic bradycardia : Patient presented with multiple episodes of lightheadedness and fatigue in the past couple days.  None currently.   He was found to have heart rate in the 40s to 50s with baseline typically in the 70s. Cardiology was consulted, recommended to avoid AV nodal blocking medications. Continue to monitor on telemetry.  Remains in sinus bradycardia Not on any nodal blocking agents Continue IV maintenance fluids given nausea and vomiting. Cardiology signed off,  follow-up outpatient in 2 weeks   Nausea and vomiting /Abdominal Pain: There is no leukocytosis to point towards infectious etiology, possibly viral 6/21: Patient complained of severe abdominal pain radiating towards the back. CT abdomen and pelvis no acute abdominal pathology found. Continue supportive care for now. Continue IV Zofran.   History of diabetes Not on any medications, CBG 158 in the ED.  DVT prophylaxis: Lovenox. Code Status: Full code. Family Communication:  No family at bed side. Disposition Plan:  Status is: Inpatient  Remains  inpatient appropriate because:Inpatient level of care appropriate due to severity of illness  Dispo: The patient is from: Home              Anticipated d/c is to: Home              Patient currently is not medically stable to d/c.   Difficult to place patient No  Consultants:  Cardiology  Procedures:  None Antimicrobials:   Anti-infectives (From admission, onward)    None       Subjective: Patient was seen and examined at bedside.  Overnight events noted.   Patient reports having severe abdominal pain radiating towards the back.  Reports he cannot tolerate any food. CT abdomen and pelvis: No acute abdominal abnormality found.  Objective: Vitals:   12/20/20 0510 12/20/20 1353 12/20/20 2130 12/21/20 0333  BP: 100/71 (!) 165/85 111/76 (!) 181/97  Pulse: (!) 51 (!) 42 (!) 52 (!) 47  Resp: 16   16  Temp: 98.8 F (37.1 C) 98.8 F (37.1 C) 97.8 F (36.6 C) 98.3 F (36.8 C)  TempSrc: Oral Oral Oral Oral  SpO2: 99% 99% 98% 99%  Weight: 74.3 kg   72.5 kg  Height:        Intake/Output Summary (Last 24 hours) at 12/21/2020 1433 Last data filed at 12/21/2020 0342 Gross per 24 hour  Intake 3969.15 ml  Output 800 ml  Net 3169.15 ml   Filed Weights   12/19/20 2244 12/20/20 0510 12/21/20 0333  Weight: 72.4 kg 74.3 kg 72.5 kg    Examination:  General exam: Appears calm and comfortable, appears in a lot of abdominal pain.  Respiratory system: Clear to auscultation. Respiratory effort normal. Cardiovascular system: S1 & S2 heard, RRR. No JVD, murmurs, rubs, gallops or clicks. No pedal edema. Gastrointestinal system: Abdomen is nondistended, soft and tenderness ++, no guarding . No organomegaly or masses felt. Normal bowel sounds heard. Central nervous system: Alert and oriented. No focal neurological deficits. Extremities: Symmetric 5 x 5 power.  No edema, no cyanosis, no clubbing. Skin: No rashes, lesions or ulcers Psychiatry: Judgement and insight appear normal. Mood & affect  appropriate.     Data Reviewed: I have personally reviewed following labs and imaging studies  CBC: Recent Labs  Lab 12/19/20 1414 12/20/20 0525  WBC 10.1 11.5*  HGB 15.8 13.9  HCT 47.7 41.4  MCV 95.4 94.1  PLT 272 250    Basic Metabolic Panel: Recent Labs  Lab 12/19/20 1414 12/19/20 1513 12/20/20 0525  NA 135  --  137  K 3.8  --  3.7  CL 101  --  102  CO2 23  --  26  GLUCOSE 158*  --  105*  BUN 13  --  9  CREATININE 0.83  --  1.03  CALCIUM 9.4  --  8.9  MG  --  1.8  --     GFR: Estimated Creatinine Clearance: 86.7 mL/min (by C-G formula based on SCr of 1.03 mg/dL). Liver Function Tests: Recent Labs  Lab 12/19/20 1414 12/20/20 0525  AST 31 22  ALT 35 25  ALKPHOS 92 70  BILITOT 1.1 1.2  PROT 8.0 6.3*  ALBUMIN 4.9 3.7    Recent Labs  Lab 12/19/20 1414  LIPASE 23    No results for input(s): AMMONIA in the last 168 hours. Coagulation Profile: No results for input(s): INR, PROTIME in the last 168 hours. Cardiac Enzymes: No results for input(s): CKTOTAL, CKMB, CKMBINDEX, TROPONINI in the last 168 hours. BNP (last 3 results) No results for input(s): PROBNP in the last 8760 hours. HbA1C: No results for input(s): HGBA1C in the last 72 hours. CBG: Recent Labs  Lab 12/20/20 1149 12/20/20 1607 12/20/20 2127 12/21/20 0805 12/21/20 1201  GLUCAP 152* 124* 100* 112* 120*    Lipid Profile: Recent Labs    12/20/20 0525  CHOL 164  HDL 49  LDLCALC 88  TRIG 136  CHOLHDL 3.3    Thyroid Function Tests: Recent Labs    12/20/20 0525  TSH 0.673    Anemia Panel: No results for input(s): VITAMINB12, FOLATE, FERRITIN, TIBC, IRON, RETICCTPCT in the last 72 hours. Sepsis Labs: No results for input(s): PROCALCITON, LATICACIDVEN in the last 168 hours.  Recent Results (from the past 240 hour(s))  Resp Panel by RT-PCR (Flu A&B, Covid) Nasopharyngeal Swab     Status: None   Collection Time: 12/19/20  6:41 PM   Specimen: Nasopharyngeal Swab;  Nasopharyngeal(NP) swabs in vial transport medium  Result Value Ref Range Status   SARS Coronavirus 2 by RT PCR NEGATIVE NEGATIVE Final    Comment: (NOTE) SARS-CoV-2 target nucleic acids are NOT DETECTED.  The SARS-CoV-2 RNA is generally detectable in upper respiratory specimens during the acute phase of infection. The lowest concentration of SARS-CoV-2 viral copies this assay can detect is 138 copies/mL. A negative result does not preclude SARS-Cov-2 infection and should not be used as the sole basis for treatment or other patient management decisions. A negative result may occur with  improper specimen collection/handling, submission of specimen other than nasopharyngeal swab, presence of viral mutation(s) within the areas targeted by this assay, and inadequate number  of viral copies(<138 copies/mL). A negative result must be combined with clinical observations, patient history, and epidemiological information. The expected result is Negative.  Fact Sheet for Patients:  BloggerCourse.comhttps://www.fda.gov/media/152166/download  Fact Sheet for Healthcare Providers:  SeriousBroker.ithttps://www.fda.gov/media/152162/download  This test is no t yet approved or cleared by the [place] FDA and  has been authorized for detection and/or diagnosis of SARS-CoV-2 by FDA under an Emergency Use Authorization (EUA). This EUA will remain  in effect (meaning this test can be used) for the duration of the COVID-19 declaration under Section 564(b)(1) of the Act, 21 U.S.C.section 360bbb-3(b)(1), unless the authorization is terminated  or revoked sooner.       Influenza A by PCR NEGATIVE NEGATIVE Final   Influenza B by PCR NEGATIVE NEGATIVE Final    Comment: (NOTE) The Xpert Xpress SARS-CoV-2/FLU/RSV plus assay is intended as an aid in the diagnosis of influenza from Nasopharyngeal swab specimens and should not be used as a sole basis for treatment. Nasal washings and aspirates are unacceptable for Xpert Xpress  SARS-CoV-2/FLU/RSV testing.  Fact Sheet for Patients: BloggerCourse.comhttps://www.fda.gov/media/152166/download  Fact Sheet for Healthcare Providers: SeriousBroker.ithttps://www.fda.gov/media/152162/download  This test is not yet approved or cleared by the [place] FDA and has been authorized for detection and/or diagnosis of SARS-CoV-2 by FDA under an Emergency Use Authorization (EUA). This EUA will remain in effect (meaning this test can be used) for the duration of the COVID-19 declaration under Section 564(b)(1) of the Act, 21 U.S.C. section 360bbb-3(b)(1), unless the authorization is terminated or revoked.  Performed at [facility], [address] [place], [place] [address]      Radiology Studies: DG Chest 2 View  Result Date: 12/19/2020 CLINICAL DATA:  Shortness of breath.  Abdominal pain. EXAM: CHEST - 2 VIEW COMPARISON:  None. FINDINGS: The heart size and mediastinal contours are within normal limits. Both lungs are clear. The visualized skeletal structures are unremarkable. IMPRESSION: No active cardiopulmonary disease. Electronically Signed   By: Annia Beltrew  Davis M.D.   On: 12/19/2020 15:04   CT ABDOMEN PELVIS W CONTRAST  Result Date: 12/21/2020 CLINICAL DATA:  Abdominal pain EXAM: CT ABDOMEN AND PELVIS WITH CONTRAST TECHNIQUE: Multidetector CT imaging of the abdomen and pelvis was performed using the standard protocol following bolus administration of intravenous contrast. CONTRAST:  100mL OMNIPAQUE IOHEXOL 300 MG/ML SOLN, additional oral enteric contrast COMPARISON:  None. FINDINGS: Lower chest: No acute abnormality. Hepatobiliary: No solid liver abnormality is seen. Hepatic steatosis. No gallstones, gallbladder wall thickening, or biliary dilatation. Pancreas: Unremarkable. No pancreatic ductal dilatation or surrounding inflammatory changes. Spleen: Normal in size without significant abnormality. Adrenals/Urinary Tract: Adrenal glands are unremarkable. Kidneys are normal, without renal calculi,  solid lesion, or hydronephrosis. Bladder is unremarkable. Stomach/Bowel: Stomach is within normal limits. Appendix appears normal. No evidence of bowel wall thickening, distention, or inflammatory changes. Occasional descending and sigmoid diverticula. Vascular/Lymphatic: Aortic atherosclerosis. No enlarged abdominal or pelvic lymph nodes. Reproductive: No mass or other significant abnormality. Other: No abdominal wall hernia or abnormality. No abdominopelvic ascites. Musculoskeletal: No acute or significant osseous findings. IMPRESSION: 1. No acute CT findings of the abdomen or pelvis to explain abdominal pain. 2. Hepatic steatosis. 3. Occasional descending and sigmoid diverticula without evidence of acute diverticulitis. Aortic Atherosclerosis (ICD10-I70.0). Electronically Signed   By: Lauralyn PrimesAlex  Bibbey M.D.   On: 12/21/2020 13:56    Scheduled Meds:  atorvastatin  10 mg Oral Daily   enoxaparin (LOVENOX) injection  40 mg Subcutaneous Q24H   sodium chloride flush  3 mL  Intravenous Q12H   Continuous Infusions:  lactated ringers 125 mL/hr at 12/21/20 1057     LOS: 0 days    Time spent: 25 mins    Akelia Husted, MD [facility]   If 7PM-7AM, please contact night-coverage

## 2020-12-22 DIAGNOSIS — R001 Bradycardia, unspecified: Principal | ICD-10-CM

## 2020-12-22 DIAGNOSIS — Z8639 Personal history of other endocrine, nutritional and metabolic disease: Secondary | ICD-10-CM

## 2020-12-22 DIAGNOSIS — R1030 Lower abdominal pain, unspecified: Secondary | ICD-10-CM

## 2020-12-22 LAB — CBC
HCT: 43.9 % (ref 39.0–52.0)
Hemoglobin: 15 g/dL (ref 13.0–17.0)
MCH: 31.7 pg (ref 26.0–34.0)
MCHC: 34.2 g/dL (ref 30.0–36.0)
MCV: 92.8 fL (ref 80.0–100.0)
Platelets: 262 10*3/uL (ref 150–400)
RBC: 4.73 MIL/uL (ref 4.22–5.81)
RDW: 11.9 % (ref 11.5–15.5)
WBC: 8.2 10*3/uL (ref 4.0–10.5)
nRBC: 0 % (ref 0.0–0.2)

## 2020-12-22 LAB — COMPREHENSIVE METABOLIC PANEL
ALT: 22 U/L (ref 0–44)
AST: 20 U/L (ref 15–41)
Albumin: 3.9 g/dL (ref 3.5–5.0)
Alkaline Phosphatase: 70 U/L (ref 38–126)
Anion gap: 11 (ref 5–15)
BUN: 8 mg/dL (ref 6–20)
CO2: 26 mmol/L (ref 22–32)
Calcium: 9.2 mg/dL (ref 8.9–10.3)
Chloride: 97 mmol/L — ABNORMAL LOW (ref 98–111)
Creatinine, Ser: 1.02 mg/dL (ref 0.61–1.24)
GFR, Estimated: >60 mL/min (ref >60)
Glucose, Bld: 97 mg/dL (ref 70–99)
Potassium: 3.6 mmol/L (ref 3.5–5.1)
Sodium: 134 mmol/L — ABNORMAL LOW (ref 135–145)
Total Bilirubin: 1.4 mg/dL — ABNORMAL HIGH (ref 0.3–1.2)
Total Protein: 6.5 g/dL (ref 6.5–8.1)

## 2020-12-22 LAB — MAGNESIUM: Magnesium: 2.1 mg/dL (ref 1.7–2.4)

## 2020-12-22 LAB — PHOSPHORUS: Phosphorus: 3.8 mg/dL (ref 2.5–4.6)

## 2020-12-22 LAB — GLUCOSE, CAPILLARY: Glucose-Capillary: 98 mg/dL (ref 70–99)

## 2020-12-22 MED ORDER — ONDANSETRON HCL 4 MG PO TABS: 4.0000 mg | ORAL_TABLET | Freq: Four times a day (QID) | ORAL | 0 refills | Status: AC

## 2020-12-22 NOTE — Plan of Care (Signed)
  Problem: Education: Goal: Knowledge of General Education information will improve Description: Including pain rating scale, medication(s)/side effects and non-pharmacologic comfort measures 12/22/2020 1020 by Durward Fortes, RN Outcome: Adequate for Discharge 12/22/2020 0756 by Durward Fortes, RN Outcome: Progressing   Problem: Health Behavior/Discharge Planning: Goal: Ability to manage health-related needs will improve 12/22/2020 1020 by Durward Fortes, RN Outcome: Adequate for Discharge 12/22/2020 0756 by Durward Fortes, RN Outcome: Progressing   Problem: Clinical Measurements: Goal: Ability to maintain clinical measurements within normal limits will improve 12/22/2020 1020 by Durward Fortes, RN Outcome: Adequate for Discharge 12/22/2020 0756 by Durward Fortes, RN Outcome: Progressing Goal: Will remain free from infection 12/22/2020 1020 by Durward Fortes, RN Outcome: Adequate for Discharge 12/22/2020 0756 by Durward Fortes, RN Outcome: Progressing Goal: Diagnostic test results will improve 12/22/2020 1020 by Durward Fortes, RN Outcome: Adequate for Discharge 12/22/2020 0756 by Durward Fortes, RN Outcome: Progressing Goal: Respiratory complications will improve 12/22/2020 1020 by Durward Fortes, RN Outcome: Adequate for Discharge 12/22/2020 0756 by Durward Fortes, RN Outcome: Progressing Goal: Cardiovascular complication will be avoided 12/22/2020 1020 by Durward Fortes, RN Outcome: Adequate for Discharge 12/22/2020 0756 by Durward Fortes, RN Outcome: Progressing   Problem: Activity: Goal: Risk for activity intolerance will decrease 12/22/2020 1020 by Durward Fortes, RN Outcome: Adequate for Discharge 12/22/2020 0756 by Durward Fortes, RN Outcome: Progressing   Problem: Nutrition: Goal: Adequate nutrition will be maintained 12/22/2020 1020 by Durward Fortes, RN Outcome: Adequate for Discharge 12/22/2020 0756 by Durward Fortes, RN Outcome: Progressing   Problem:  Coping: Goal: Level of anxiety will decrease 12/22/2020 1020 by Durward Fortes, RN Outcome: Adequate for Discharge 12/22/2020 0756 by Durward Fortes, RN Outcome: Progressing   Problem: Elimination: Goal: Will not experience complications related to bowel motility 12/22/2020 1020 by Durward Fortes, RN Outcome: Adequate for Discharge 12/22/2020 0756 by Durward Fortes, RN Outcome: Progressing Goal: Will not experience complications related to urinary retention 12/22/2020 1020 by Durward Fortes, RN Outcome: Adequate for Discharge 12/22/2020 0756 by Durward Fortes, RN Outcome: Progressing   Problem: Pain Managment: Goal: General experience of comfort will improve 12/22/2020 1020 by Durward Fortes, RN Outcome: Adequate for Discharge 12/22/2020 0756 by Durward Fortes, RN Outcome: Progressing   Problem: Safety: Goal: Ability to remain free from injury will improve 12/22/2020 1020 by Durward Fortes, RN Outcome: Adequate for Discharge 12/22/2020 0756 by Durward Fortes, RN Outcome: Progressing   Problem: Skin Integrity: Goal: Risk for impaired skin integrity will decrease 12/22/2020 1020 by Durward Fortes, RN Outcome: Adequate for Discharge 12/22/2020 0756 by Durward Fortes, RN Outcome: Progressing

## 2020-12-22 NOTE — Progress Notes (Signed)
Pt is alert and oriented. Discharge instructions/ AVS given to pt. 

## 2020-12-22 NOTE — Discharge Summary (Signed)
Physician Discharge Summary  Eugene Myers GEX:[id number] DOB: Mar 07, 1976 DOA: 12/19/2020  PCP: Georgianne Fick, MD  Admit date: 12/19/2020 Discharge date: 12/22/2020  Admitted From: Home Disposition: Home   Recommendations for Outpatient Follow-up:  Follow up with PCP in 1-2 weeks Follow up with cardiology, Dr. Sharyn Lull in 2 weeks. Avoid AV nodal agents in patient with bradycardia.  Follow up with GI if abdominal pain returns.  Home Health: None Equipment/Devices: None Discharge Condition: Stable CODE STATUS: Full Diet recommendation: Heart healthy  Brief/Interim Summary: Eugene Myers is a 45 y.o. male with a history of T2DM and lactose intolerance who presented to UC on 6/19, the day after drinking some alcohol at a cookout, for lightheadedness associated with nausea and vomiting. He was referred to the ED due to bradycardia, found to have sinus bradycardia in 40-50's bpm, troponins normal. Chest x-ray no acute abnormality.  Cardiology was consulted and recommended continued observation without intervention, avoiding AV nodal agents. The patient remains asymptomatic from cardiac perspective. Abdominal pain, vomiting have improved. Lipase, LFTs and CT abd/pelvis were unremarkable.    Discharge Diagnoses:  Principal Problem:   Symptomatic bradycardia Active Problems:   Abdominal pain   History of diabetes mellitus  Symptomatic bradycardia: Stabilized and symptoms resolved.  - Cardiology was consulted, recommended to avoid AV nodal blocking medications, follow-up outpatient in 2 weeks   Nausea and vomiting /Abdominal Pain: Improved. No evidence of sepsis. Unremarkable CT A/P and labs. Possibly related to viral gastroenteritis (remains unclear) - Improving with supportive measures.  - If symptoms recur in setting of alcohol intake, urged cessation and follow up  History of diabetes Not on any medications, CBG 158 in the ED.  Discharge Instructions Discharge Instructions      Discharge instructions   Complete by: As directed    Follow up with cardiology in about 2 weeks for slow heart rate.   If your abdominal pain/vomiting worsen, seek medical attention and if they persist call GI to schedule an appointment.  You may take zofran as needed for nausea/vomiting (sent to your pharmacy)      Allergies as of 12/22/2020   No Known Allergies      Medication List     TAKE these medications    acetaminophen 500 MG tablet Commonly known as: TYLENOL Take 500-1,000 mg by mouth every 6 (six) hours as needed for mild pain (or headaches).   ondansetron 4 MG tablet Commonly known as: ZOFRAN Take 1 tablet (4 mg total) by mouth every 6 (six) hours.        Follow-up Information     Georgianne Fick, MD Follow up.   Specialty: Internal Medicine Contact information: [address] [place] [address] [place] [address] [phone]         Rinaldo Cloud, MD. Schedule an appointment as soon as possible for a visit in 2 week(s).   Specialty: Cardiology Contact information: [address] [place] [address] [phone]         [facility] Follow up.   Specialty: Gastroenterology Why: If symptoms worsen Contact information: [address] [place] [address] [phone]               No Known Allergies  Consultations: Cardiology, Dr. Sharyn Lull  Procedures/Studies: DG Chest 2 View  Result Date: 12/19/2020 CLINICAL DATA:  Shortness of breath.  Abdominal pain. EXAM: CHEST - 2 VIEW COMPARISON:  None. FINDINGS: The heart size and mediastinal contours are within normal limits. Both lungs are clear. The  visualized skeletal structures are unremarkable. IMPRESSION: No active cardiopulmonary disease. Electronically Signed   By: Annia Belt M.D.   On: 12/19/2020 15:04   CT ABDOMEN PELVIS W CONTRAST  Result Date: 12/21/2020 CLINICAL DATA:  Abdominal pain EXAM: CT ABDOMEN AND PELVIS  WITH CONTRAST TECHNIQUE: Multidetector CT imaging of the abdomen and pelvis was performed using the standard protocol following bolus administration of intravenous contrast. CONTRAST:  OMNIPAQUE IOHEXOL 300 MG/ML SOLN, additional oral enteric contrast COMPARISON:  None. FINDINGS: Lower chest: No acute abnormality. Hepatobiliary: No solid liver abnormality is seen. Hepatic steatosis. No gallstones, gallbladder wall thickening, or biliary dilatation. Pancreas: Unremarkable. No pancreatic ductal dilatation or surrounding inflammatory changes. Spleen: Normal in size without significant abnormality. Adrenals/Urinary Tract: Adrenal glands are unremarkable. Kidneys are normal, without renal calculi, solid lesion, or hydronephrosis. Bladder is unremarkable. Stomach/Bowel: Stomach is within normal limits. Appendix appears normal. No evidence of bowel wall thickening, distention, or inflammatory changes. Occasional descending and sigmoid diverticula. Vascular/Lymphatic: Aortic atherosclerosis. No enlarged abdominal or pelvic lymph nodes. Reproductive: No mass or other significant abnormality. Other: No abdominal wall hernia or abnormality. No abdominopelvic ascites. Musculoskeletal: No acute or significant osseous findings. IMPRESSION: 1. No acute CT findings of the abdomen or pelvis to explain abdominal pain. 2. Hepatic steatosis. 3. Occasional descending and sigmoid diverticula without evidence of acute diverticulitis. Aortic Atherosclerosis (ICD10-I70.0). Electronically Signed   By: Lauralyn Primes M.D.   On: 12/21/2020 13:56     Subjective: Eating better, abdominal pain improved. No vomiting. No syncope/presyncope or chest pain or dyspnea.   Discharge Exam: Vitals:   12/21/20 2300 12/22/20 0549  BP: 130/86 127/89  Pulse:  (!) 50  Resp:  16  Temp:  99 F (37.2 C)  SpO2:  97%   General: Pt is alert, awake, not in acute distress Cardiovascular: RRR, S1/S2 +, no rubs, no gallops Respiratory: CTA  bilaterally, no wheezing, no rhonchi Abdominal: Soft, NT, ND, bowel sounds + Extremities: No edema, no cyanosis  Labs: BNP (last 3 results) No results for input(s): BNP in the last 8760 hours. Basic Metabolic Panel: Recent Labs  Lab 12/19/20 1414 12/19/20 1513 12/20/20 0525 12/22/20 0240  NA 135  --  137 134*  K 3.8  --  3.7 3.6  CL 101  --  102 97*  CO2 23  --  26 26  GLUCOSE 158*  --  105* 97  BUN 13  --  9 8  CREATININE 0.83  --  1.03 1.02  CALCIUM 9.4  --  8.9 9.2  MG  --  1.8  --  2.1  PHOS  --   --   --  3.8   Liver Function Tests: Recent Labs  Lab 12/19/20 1414 12/20/20 0525 12/22/20 0240  AST 31 22 20   ALT 35 25 22  ALKPHOS 92 70 70  BILITOT 1.1 1.2 1.4*  PROT 8.0 6.3* 6.5  ALBUMIN 4.9 3.7 3.9   Recent Labs  Lab 12/19/20 1414 12/21/20 1509  LIPASE 23 25   No results for input(s): AMMONIA in the last 168 hours. CBC: Recent Labs  Lab 12/19/20 1414 12/20/20 0525 12/22/20 0240  WBC 10.1 11.5* 8.2  HGB 15.8 13.9 15.0  HCT 47.7 41.4 43.9  MCV 95.4 94.1 92.8  PLT 272 250 262   Cardiac Enzymes: No results for input(s): CKTOTAL, CKMB, CKMBINDEX, TROPONINI in the last 168 hours. BNP: Invalid input(s): POCBNP CBG: Recent Labs  Lab 12/21/20 0805 12/21/20 1201 12/21/20 1728 12/21/20 2041 12/22/20 0755  GLUCAP 112* 120* 151* 105* 98   D-Dimer No results for input(s): DDIMER in the last 72 hours. Hgb A1c No results for input(s): HGBA1C in the last 72 hours. Lipid Profile Recent Labs    12/20/20 0525  CHOL 164  HDL 49  LDLCALC 88  TRIG 136  CHOLHDL 3.3   Thyroid function studies Recent Labs    12/20/20 0525  TSH 0.673   Anemia work up No results for input(s): VITAMINB12, FOLATE, FERRITIN, TIBC, IRON, RETICCTPCT in the last 72 hours. Urinalysis    Component Value Date/Time   COLORURINE YELLOW 12/19/2020 1414   APPEARANCEUR CLEAR 12/19/2020 1414   LABSPEC >1.030 (H) 12/19/2020 1414   PHURINE 6.0 12/19/2020 1414   GLUCOSEU  NEGATIVE 12/19/2020 1414   HGBUR TRACE (A) 12/19/2020 1414   BILIRUBINUR NEGATIVE 12/19/2020 1414   KETONESUR NEGATIVE 12/19/2020 1414   PROTEINUR NEGATIVE 12/19/2020 1414   UROBILINOGEN 0.2 02/01/2014 1642   NITRITE NEGATIVE 12/19/2020 1414   LEUKOCYTESUR NEGATIVE 12/19/2020 1414    Microbiology Recent Results (from the past 240 hour(s))  Resp Panel by RT-PCR (Flu A&B, Covid) Nasopharyngeal Swab     Status: None   Collection Time: 12/19/20  6:41 PM   Specimen: Nasopharyngeal Swab; Nasopharyngeal(NP) swabs in vial transport medium  Result Value Ref Range Status   SARS Coronavirus 2 by RT PCR NEGATIVE NEGATIVE Final    Comment: (NOTE) SARS-CoV-2 target nucleic acids are NOT DETECTED.  The SARS-CoV-2 RNA is generally detectable in upper respiratory specimens during the acute phase of infection. The lowest concentration of SARS-CoV-2 viral copies this assay can detect is 138 copies/mL. A negative result does not preclude SARS-Cov-2 infection and should not be used as the sole basis for treatment or other patient management decisions. A negative result may occur with  improper specimen collection/handling, submission of specimen other than nasopharyngeal swab, presence of viral mutation(s) within the areas targeted by this assay, and inadequate number of viral copies(<138 copies/mL). A negative result must be combined with clinical observations, patient history, and epidemiological information. The expected result is Negative.  Fact Sheet for Patients:  BloggerCourse.com  Fact Sheet for Healthcare Providers:  SeriousBroker.it  This test is no t yet approved or cleared by the [place] FDA and  has been authorized for detection and/or diagnosis of SARS-CoV-2 by FDA under an Emergency Use Authorization (EUA). This EUA will remain  in effect (meaning this test can be used) for the duration of the COVID-19 declaration under  Section 564(b)(1) of the Act, 21 U.S.C.section 360bbb-3(b)(1), unless the authorization is terminated  or revoked sooner.       Influenza A by PCR NEGATIVE NEGATIVE Final   Influenza B by PCR NEGATIVE NEGATIVE Final    Comment: (NOTE) The Xpert Xpress SARS-CoV-2/FLU/RSV plus assay is intended as an aid in the diagnosis of influenza from Nasopharyngeal swab specimens and should not be used as a sole basis for treatment. Nasal washings and aspirates are unacceptable for Xpert Xpress SARS-CoV-2/FLU/RSV testing.  Fact Sheet for Patients: BloggerCourse.com  Fact Sheet for Healthcare Providers: SeriousBroker.it  This test is not yet approved or cleared by the [place] FDA and has been authorized for detection and/or diagnosis of SARS-CoV-2 by FDA under an Emergency Use Authorization (EUA). This EUA will remain in effect (meaning this test can be used) for the duration of the COVID-19 declaration under Section 564(b)(1) of the Act, 21 U.S.C. section 360bbb-3(b)(1), unless the authorization is terminated or revoked.  Performed at [facility]  Lab, [address] [place] [id number]     Time coordinating discharge: Approximately 40 minutes  Tyrone Nineyan B Tarshia Kot, MD  [facility] 12/22/2020, 10:23 AM

## 2020-12-22 NOTE — Plan of Care (Signed)

## 2020-12-23 ENCOUNTER — Encounter (HOSPITAL_COMMUNITY): Payer: Self-pay | Admitting: Pharmacy Technician

## 2020-12-23 ENCOUNTER — Emergency Department (HOSPITAL_COMMUNITY)
Admission: EM | Admit: 2020-12-23 | Discharge: 2020-12-23 | Disposition: A | Discharge status: Home / Self Care | Payer: BC Managed Care – PPO | Attending: Emergency Medicine | Admitting: Emergency Medicine

## 2020-12-23 ENCOUNTER — Other Ambulatory Visit: Payer: Self-pay

## 2020-12-23 DIAGNOSIS — E119 Type 2 diabetes mellitus without complications: Secondary | ICD-10-CM | POA: Insufficient documentation

## 2020-12-23 DIAGNOSIS — K219 Gastro-esophageal reflux disease without esophagitis: Secondary | ICD-10-CM | POA: Diagnosis not present

## 2020-12-23 DIAGNOSIS — F17210 Nicotine dependence, cigarettes, uncomplicated: Secondary | ICD-10-CM | POA: Diagnosis not present

## 2020-12-23 DIAGNOSIS — K2900 Acute gastritis without bleeding: Secondary | ICD-10-CM | POA: Diagnosis not present

## 2020-12-23 DIAGNOSIS — R1013 Epigastric pain: Secondary | ICD-10-CM | POA: Diagnosis not present

## 2020-12-23 DIAGNOSIS — K2920 Alcoholic gastritis without bleeding: Secondary | ICD-10-CM | POA: Insufficient documentation

## 2020-12-23 LAB — CBC WITH DIFFERENTIAL/PLATELET
Abs Immature Granulocytes: 0.03 10*3/uL (ref 0.00–0.07)
Basophils Absolute: 0 10*3/uL (ref 0.0–0.1)
Basophils Relative: 0 %
Eosinophils Absolute: 0 10*3/uL (ref 0.0–0.5)
Eosinophils Relative: 0 %
HCT: 45 % (ref 39.0–52.0)
Hemoglobin: 15.3 g/dL (ref 13.0–17.0)
Immature Granulocytes: 0 %
Lymphocytes Relative: 14 %
Lymphs Abs: 1.3 10*3/uL (ref 0.7–4.0)
MCH: 32 pg (ref 26.0–34.0)
MCHC: 34 g/dL (ref 30.0–36.0)
MCV: 94.1 fL (ref 80.0–100.0)
Monocytes Absolute: 0.6 10*3/uL (ref 0.1–1.0)
Monocytes Relative: 6 %
Neutro Abs: 7.5 10*3/uL (ref 1.7–7.7)
Neutrophils Relative %: 80 %
Platelets: 279 10*3/uL (ref 150–400)
RBC: 4.78 MIL/uL (ref 4.22–5.81)
RDW: 11.7 % (ref 11.5–15.5)
WBC: 9.4 10*3/uL (ref 4.0–10.5)
nRBC: 0 % (ref 0.0–0.2)

## 2020-12-23 LAB — COMPREHENSIVE METABOLIC PANEL
ALT: 28 U/L (ref 0–44)
AST: 26 U/L (ref 15–41)
Albumin: 4.4 g/dL (ref 3.5–5.0)
Alkaline Phosphatase: 84 U/L (ref 38–126)
Anion gap: 10 (ref 5–15)
BUN: 13 mg/dL (ref 6–20)
CO2: 26 mmol/L (ref 22–32)
Calcium: 9.1 mg/dL (ref 8.9–10.3)
Chloride: 99 mmol/L (ref 98–111)
Creatinine, Ser: 1.19 mg/dL (ref 0.61–1.24)
GFR, Estimated: >60 mL/min (ref >60)
Glucose, Bld: 119 mg/dL — ABNORMAL HIGH (ref 70–99)
Potassium: 3.9 mmol/L (ref 3.5–5.1)
Sodium: 135 mmol/L (ref 135–145)
Total Bilirubin: 1.5 mg/dL — ABNORMAL HIGH (ref 0.3–1.2)
Total Protein: 7.1 g/dL (ref 6.5–8.1)

## 2020-12-23 LAB — URINALYSIS, ROUTINE W REFLEX MICROSCOPIC
Bilirubin Urine: NEGATIVE
Glucose, UA: NEGATIVE mg/dL
Hgb urine dipstick: NEGATIVE
Ketones, ur: 80 mg/dL — AB
Leukocytes,Ua: NEGATIVE
Nitrite: NEGATIVE
Protein, ur: NEGATIVE mg/dL
Specific Gravity, Urine: 1.027 (ref 1.005–1.030)
pH: 6 (ref 5.0–8.0)

## 2020-12-23 LAB — LIPASE, BLOOD: Lipase: 27 U/L (ref 11–51)

## 2020-12-23 MED ORDER — ALUM & MAG HYDROXIDE-SIMETH 200-200-20 MG/5ML PO SUSP
30.0000 mL | Freq: Once | ORAL | Status: AC
  Administered 2020-12-23: 30 mL via ORAL
  Filled 2020-12-23: qty 30

## 2020-12-23 MED ORDER — LACTATED RINGERS IV BOLUS
1000.0000 mL | Freq: Once | INTRAVENOUS | Status: AC
  Administered 2020-12-23: 1000 mL via INTRAVENOUS

## 2020-12-23 MED ORDER — LIDOCAINE VISCOUS HCL 2 % MT SOLN
15.0000 mL | Freq: Once | OROMUCOSAL | Status: AC
  Administered 2020-12-23: 15 mL via ORAL
  Filled 2020-12-23: qty 15

## 2020-12-23 MED ORDER — HALOPERIDOL LACTATE 5 MG/ML IJ SOLN
2.0000 mg | Freq: Once | INTRAMUSCULAR | Status: DC
  Filled 2020-12-23: qty 1

## 2020-12-23 MED ORDER — SUCRALFATE 1 G PO TABS: 1.0000 g | ORAL_TABLET | Freq: Three times a day (TID) | ORAL | 0 refills | Status: AC

## 2020-12-23 MED ORDER — PANTOPRAZOLE SODIUM 40 MG IV SOLR
40.0000 mg | Freq: Once | INTRAVENOUS | Status: AC
  Administered 2020-12-23: 40 mg via INTRAVENOUS
  Filled 2020-12-23: qty 40

## 2020-12-23 MED ORDER — PANTOPRAZOLE SODIUM 20 MG PO TBEC: 20.0000 mg | DELAYED_RELEASE_TABLET | Freq: Two times a day (BID) | ORAL | 0 refills | Status: AC

## 2020-12-23 NOTE — ED Triage Notes (Signed)
Pt with reports of abdominal pain onset today with one episode of emesis. Pt tender to palpation. Denies dysuria.

## 2020-12-23 NOTE — Discharge Instructions (Addendum)
I have prescribed 2 new medications for you.  Please go to the pharmacy and have them filled and begin taking as prescribed.  I have provided information for a different gastroenterology physician.  Please call them to schedule an appointment.

## 2020-12-23 NOTE — ED Provider Notes (Signed)
Emergency Medicine Provider Triage Evaluation Note  Eugene Myers , a 45 y.o. male  was evaluated in triage.  Pt complains of abdominal pain. Started this morning. Nausea and vomiting x 1 episode. Tried zofran. Was discharged yesterday from ED for bradycardia. States he came to hospital for abdominal pain which resolved during the stay.  He is not drinking any alcohol, smoking cigarettes, taken any medicine other than Tylenol and Zofran since being discharged yesterday.    Review of Systems  Positive: Abdominal pain, nausea, vomiting  Negative: Chest pain, SOB  Physical Exam  BP (!) 159/100   Pulse (!) 47   Temp 98.5 F (36.9 C) (Oral)   Resp 14   SpO2 97%  Gen:   Awake, no distress   Resp:  Normal effort  MSK:   Moves extremities without difficulty  Other:  Diffuse abdominal pain   Medical Decision Making  Medically screening exam initiated at 11:51 AM.  Appropriate orders placed.  [name] was informed that the remainder of the evaluation will be completed by another provider, this initial triage assessment does not replace that evaluation, and the importance of remaining in the ED until their evaluation is complete.     Theron Arista, PA-C 12/23/20 1157    Tilden Fossa, MD 12/23/20 [phone]

## 2020-12-23 NOTE — ED Notes (Signed)
Provider bedside.

## 2020-12-23 NOTE — ED Provider Notes (Signed)
[facility] EMERGENCY DEPARTMENT Provider Note   CSN: [id number] Arrival date & time: 12/23/20  1145     History Chief Complaint  Patient presents with   Abdominal Pain    Eugene Myers is a 45 y.o. male.   Abdominal Pain Pain location:  Epigastric Pain quality: bloating and gnawing   Pain radiates to:  Does not radiate Pain severity:  Moderate Onset quality:  Gradual Duration:  2 days Timing:  Constant Progression:  Waxing and waning Chronicity:  New Context: alcohol use and eating   Context: not awakening from sleep, not diet changes, not laxative use, not medication withdrawal, not previous surgeries, not recent illness, not recent sexual activity, not recent travel, not retching, not sick contacts, not suspicious food intake and not trauma   Relieved by:  Position changes (Curling up legs and lying down relieves the pain) Worsened by:  Eating and palpation Ineffective treatments:  Acetaminophen Associated symptoms: nausea and vomiting   Associated symptoms: no anorexia, no belching, no chest pain, no chills, no constipation, no cough, no diarrhea, no dysuria, no fatigue, no fever, no flatus, no hematemesis, no hematochezia, no hematuria, no melena, no shortness of breath and no sore throat       Past Medical History:  Diagnosis Date   Back pain    MRI low back-2 discs with nerve impingement   Diabetes mellitus without complication [facility])     Patient Active Problem List   Diagnosis Date Noted   Symptomatic bradycardia 12/19/2020   Abdominal pain 12/19/2020   History of diabetes mellitus 12/19/2020   Fall 07/10/2020   Closed fracture of right tibial plateau 07/10/2020   Contusion of left shoulder 07/10/2020    History reviewed. No pertinent surgical history.     Family History  Problem Relation Age of Onset   Diabetes Mother    Diabetes Father     Social History   Tobacco Use   Smoking status: Every Day    Packs/day: 0.50     Years: 20.00    Pack years: 10.00    Types: Cigarettes   Smokeless tobacco: Never  Vaping Use   Vaping Use: Never used  Substance Use Topics   Alcohol use: Yes    Alcohol/week: 4.0 standard drinks    Types: 4 Cans of beer per week   Drug use: Yes    Types: Marijuana    Home Medications Prior to Admission medications   Medication Sig Start Date End Date Taking? Authorizing Provider  pantoprazole (PROTONIX) 20 MG tablet Take 1 tablet (20 mg total) by mouth 2 (two) times daily. 12/23/20 01/22/21 Yes Ardeen Fillers, DO  sucralfate (CARAFATE) 1 g tablet Take 1 tablet (1 g total) by mouth 4 (four) times daily -  with meals and at bedtime for 14 days. 12/23/20 01/06/21 Yes Ardeen Fillers, DO  acetaminophen (TYLENOL) 500 MG tablet Take 500-1,000 mg by mouth every 6 (six) hours as needed for mild pain (or headaches).    [provider]  ondansetron (ZOFRAN) 4 MG tablet Take 1 tablet (4 mg total) by mouth every 6 (six) hours. 12/22/20   Tyrone Nine, MD    Allergies    Patient has no known allergies.  Review of Systems   Review of Systems  Constitutional:  Negative for chills, fatigue and fever.  HENT:  Negative for ear pain and sore throat.   Eyes:  Negative for pain and visual disturbance.  Respiratory:  Negative for cough and shortness of  breath.   Cardiovascular:  Negative for chest pain and palpitations.  Gastrointestinal:  Positive for abdominal pain, nausea and vomiting. Negative for anorexia, constipation, diarrhea, flatus, hematemesis, hematochezia and melena.  Genitourinary:  Negative for dysuria and hematuria.  Musculoskeletal:  Negative for arthralgias and back pain.  Skin:  Negative for color change and rash.  Neurological:  Negative for seizures and syncope.  All other systems reviewed and are negative.  Physical Exam Updated Vital Signs BP 114/72   Pulse (!) 59   Temp 100.2 F (37.9 C) (Tympanic)   Resp (!) 21   Ht 5\' 8"  (1.727 m)   Wt 71.7 kg   SpO2  96%   BMI 24.02 kg/m   Physical Exam Vitals and nursing note reviewed.  Constitutional:      Appearance: He is well-developed. He is not ill-appearing or toxic-appearing.  HENT:     Head: Normocephalic and atraumatic.  Eyes:     Conjunctiva/sclera: Conjunctivae normal.  Cardiovascular:     Rate and Rhythm: Normal rate and regular rhythm.     Pulses:          Radial pulses are 2+ on the right side and 2+ on the left side.       Dorsalis pedis pulses are 2+ on the right side and 2+ on the left side.     Heart sounds: No murmur heard. Pulmonary:     Effort: Pulmonary effort is normal. No respiratory distress.     Breath sounds: Normal breath sounds.  Abdominal:     General: Abdomen is flat.     Palpations: Abdomen is soft.     Tenderness: There is abdominal tenderness in the epigastric area. There is no guarding or rebound. Negative signs include Murphy's sign.     Hernia: No hernia is present.     Comments:  Mild TTP in epigastrium. Otherwise, benign abdominal exam.  Musculoskeletal:     Cervical back: Neck supple.     Right lower leg: No edema.     Left lower leg: No edema.  Skin:    General: Skin is warm and dry.  Neurological:     Mental Status: He is alert.    ED Results / Procedures / Treatments   Labs (all labs ordered are listed, but only abnormal results are displayed) Labs Reviewed  COMPREHENSIVE METABOLIC PANEL - Abnormal; Notable for the following components:      Result Value   Glucose, Bld 119 (*)    Total Bilirubin 1.5 (*)    All other components within normal limits  URINALYSIS, ROUTINE W REFLEX MICROSCOPIC - Abnormal; Notable for the following components:   APPearance HAZY (*)    Ketones, ur 80 (*)    All other components within normal limits  CBC WITH DIFFERENTIAL/PLATELET  LIPASE, BLOOD    EKG None  Radiology No results found.  Procedures Procedures   Medications Ordered in ED Medications  alum & mag hydroxide-simeth (MAALOX/MYLANTA)  200-200-20 MG/5ML suspension 30 mL (has no administration in time range)    And  lidocaine (XYLOCAINE) 2 % viscous mouth solution 15 mL (has no administration in time range)  lactated ringers bolus 1,000 mL (has no administration in time range)  pantoprazole (PROTONIX) injection 40 mg (40 mg Intravenous Given 12/23/20 2025)    ED Course  I have reviewed the triage vital signs and the nursing notes.  Pertinent labs & imaging results that were available during my care of the patient were reviewed by me and  considered in my medical decision making (see chart for details).  Clinical Course as of 12/23/20 2027  Thu Dec 23, 2020  2026 Lab work reassuring - no leukocytosis to suggest infection. Electrolytes and metobolic fxn wnl. No evidence of UTI. Lipase normal.  [ZB]    Clinical Course User Index [ZB] Ardeen Fillers, DO   MDM Rules/Calculators/A&P                          Patient is a 45 year old male with history as above who presented to the emergency department with ongoing epigastric abdominal pain with associated nausea and vomiting. He was admitted over the past weekend for symptomatic bradycardia with negative cardiac work-up including negative troponins, cardiology consultation who did not recommend any intervention or further evaluation other than avoiding AV nodal blockers.  He was discharged yesterday morning.  He was also being treated for abdominal pain.  He had a negative CT abdomen/pelvis, blood work was reassuring without evidence of infection or other intra-abdominal pathology that would suggest an etiology to his pain.  Symptoms were controlled on discharge however abdominal pain recurred today prompting visit to the emergency department again.  Likely gastritis.  Much lower index of suspicion for bowel obstruction, appendicitis, renal colic with nephrolithiasis, cholecystitis. Exam only notable for some slight epigastric tenderness to palpation.  Repeat lab work today  also without abnormality including normal lipase. No reported dark or black stools.  He refused rectal exam despite an explanation on the importance of testing his stool. Treated symptomatically with IV crystalloid, GI cocktail and haldol for nausea. Do not feel as though repeat or further imaging today is indicated based on reassuring history, exam , recent CT and lab work today.  See clinical course above for further medical decision making.   Final Clinical Impression(s) / ED Diagnoses Final diagnoses:  Acute alcoholic gastritis without hemorrhage  Gastroesophageal reflux disease, unspecified whether esophagitis present    Rx / DC Orders ED Discharge Orders          Ordered    pantoprazole (PROTONIX) 20 MG tablet  2 times daily        12/23/20 2017    sucralfate (CARAFATE) 1 g tablet  3 times daily with meals & bedtime        12/23/20 2017             Ardeen Fillers, DO 12/23/20 2027    Virgina Norfolk, DO 12/23/20 2055

## 2020-12-23 NOTE — ED Notes (Signed)
The patient is complaining of severe abdominal pain and feeling like he might pass out. This patient completed an updated set of vitals. This EMT informed the patient that the nurse would be informed of the patient's pain and updated vitals to see if the patient could have anything for pain.

## 2020-12-31 DIAGNOSIS — K529 Noninfective gastroenteritis and colitis, unspecified: Secondary | ICD-10-CM | POA: Diagnosis not present

## 2020-12-31 DIAGNOSIS — R001 Bradycardia, unspecified: Secondary | ICD-10-CM | POA: Diagnosis not present

## 2020-12-31 DIAGNOSIS — E119 Type 2 diabetes mellitus without complications: Secondary | ICD-10-CM | POA: Diagnosis not present

## 2020-12-31 DIAGNOSIS — F17290 Nicotine dependence, other tobacco product, uncomplicated: Secondary | ICD-10-CM | POA: Diagnosis not present

## 2022-01-13 IMAGING — CR DG CHEST 2V
2 series · 2 of 2 positions shown · non-contrast
Comparison: None.

CLINICAL DATA: Shortness of breath.  Abdominal pain.

EXAM:
CHEST - 2 VIEW

[chest pa]
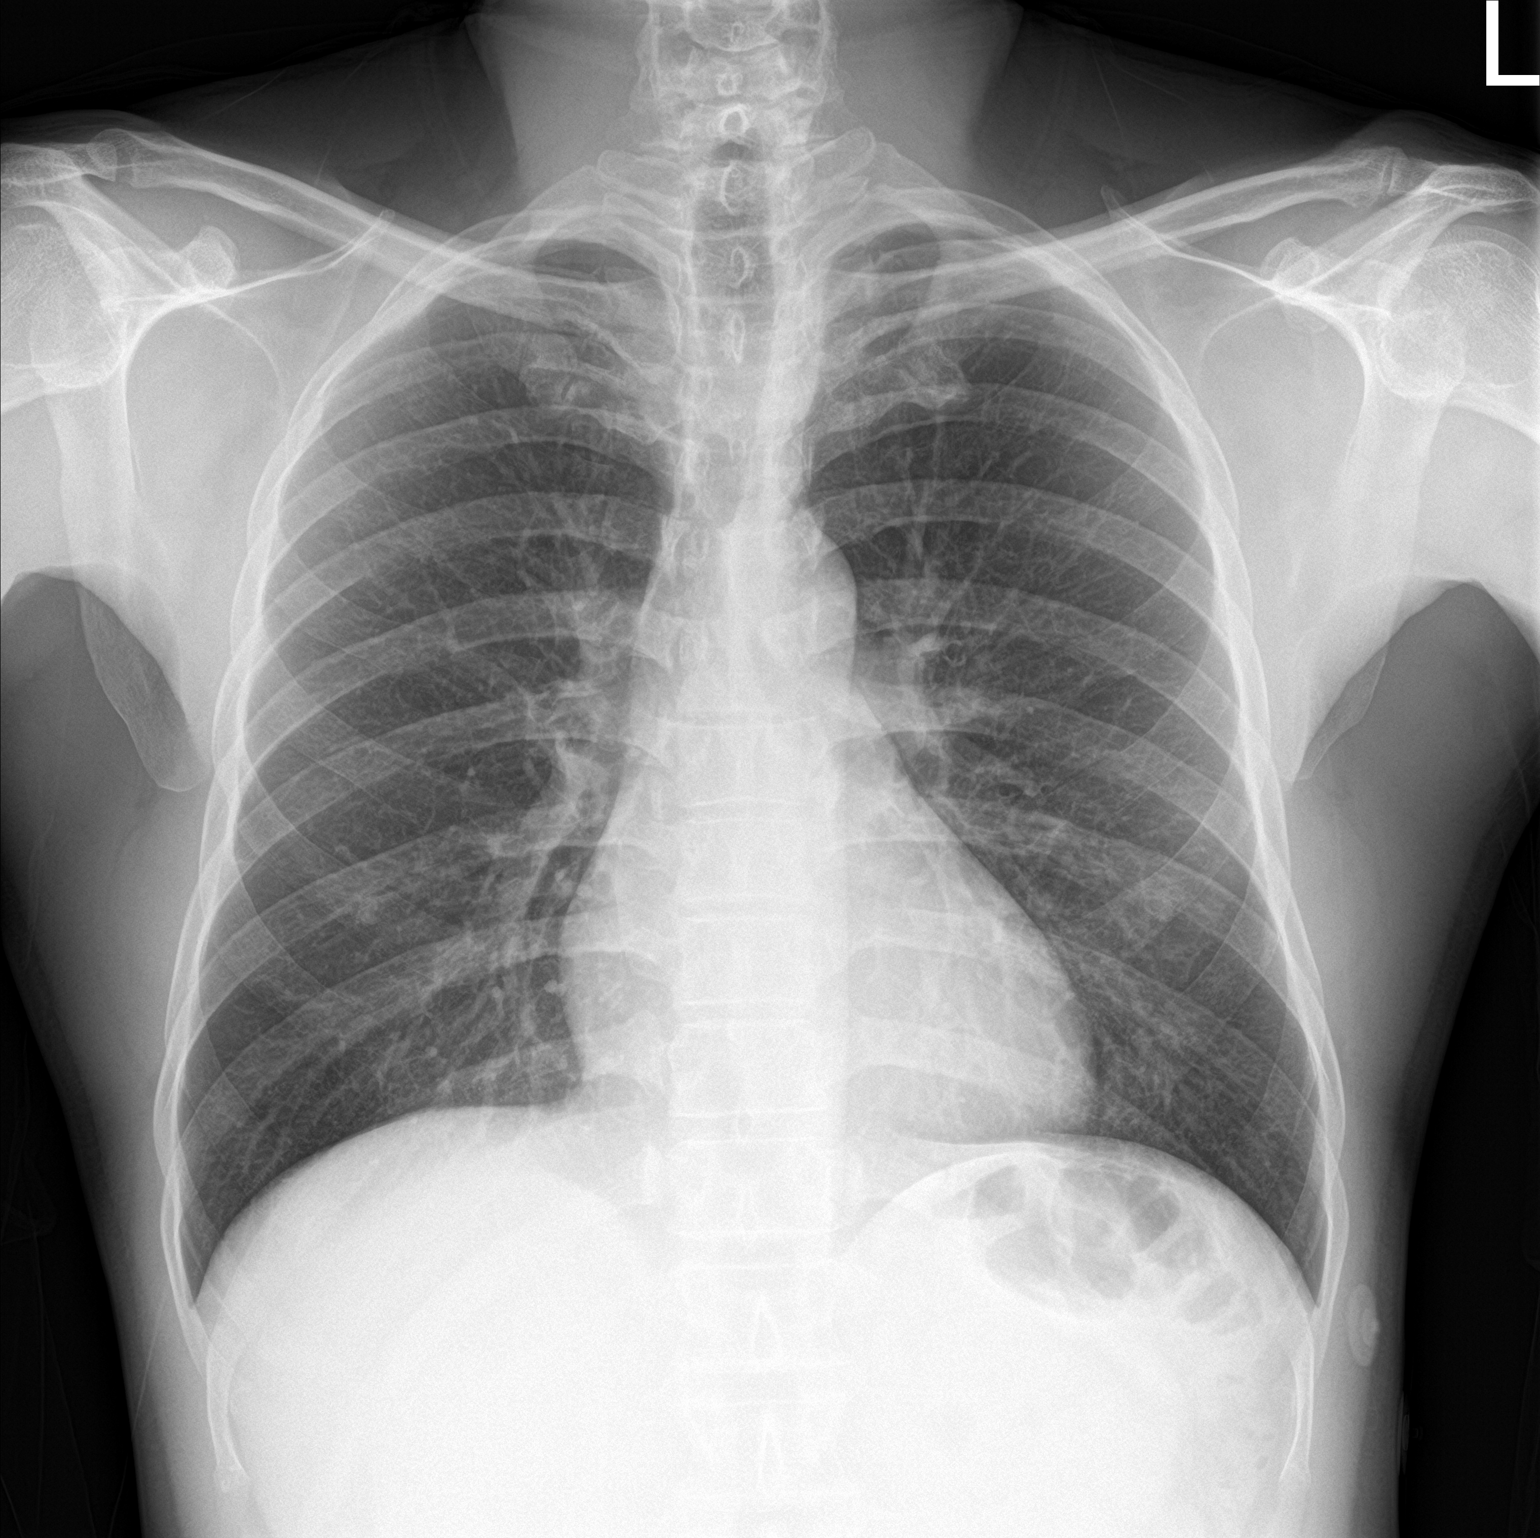

[chest lat]
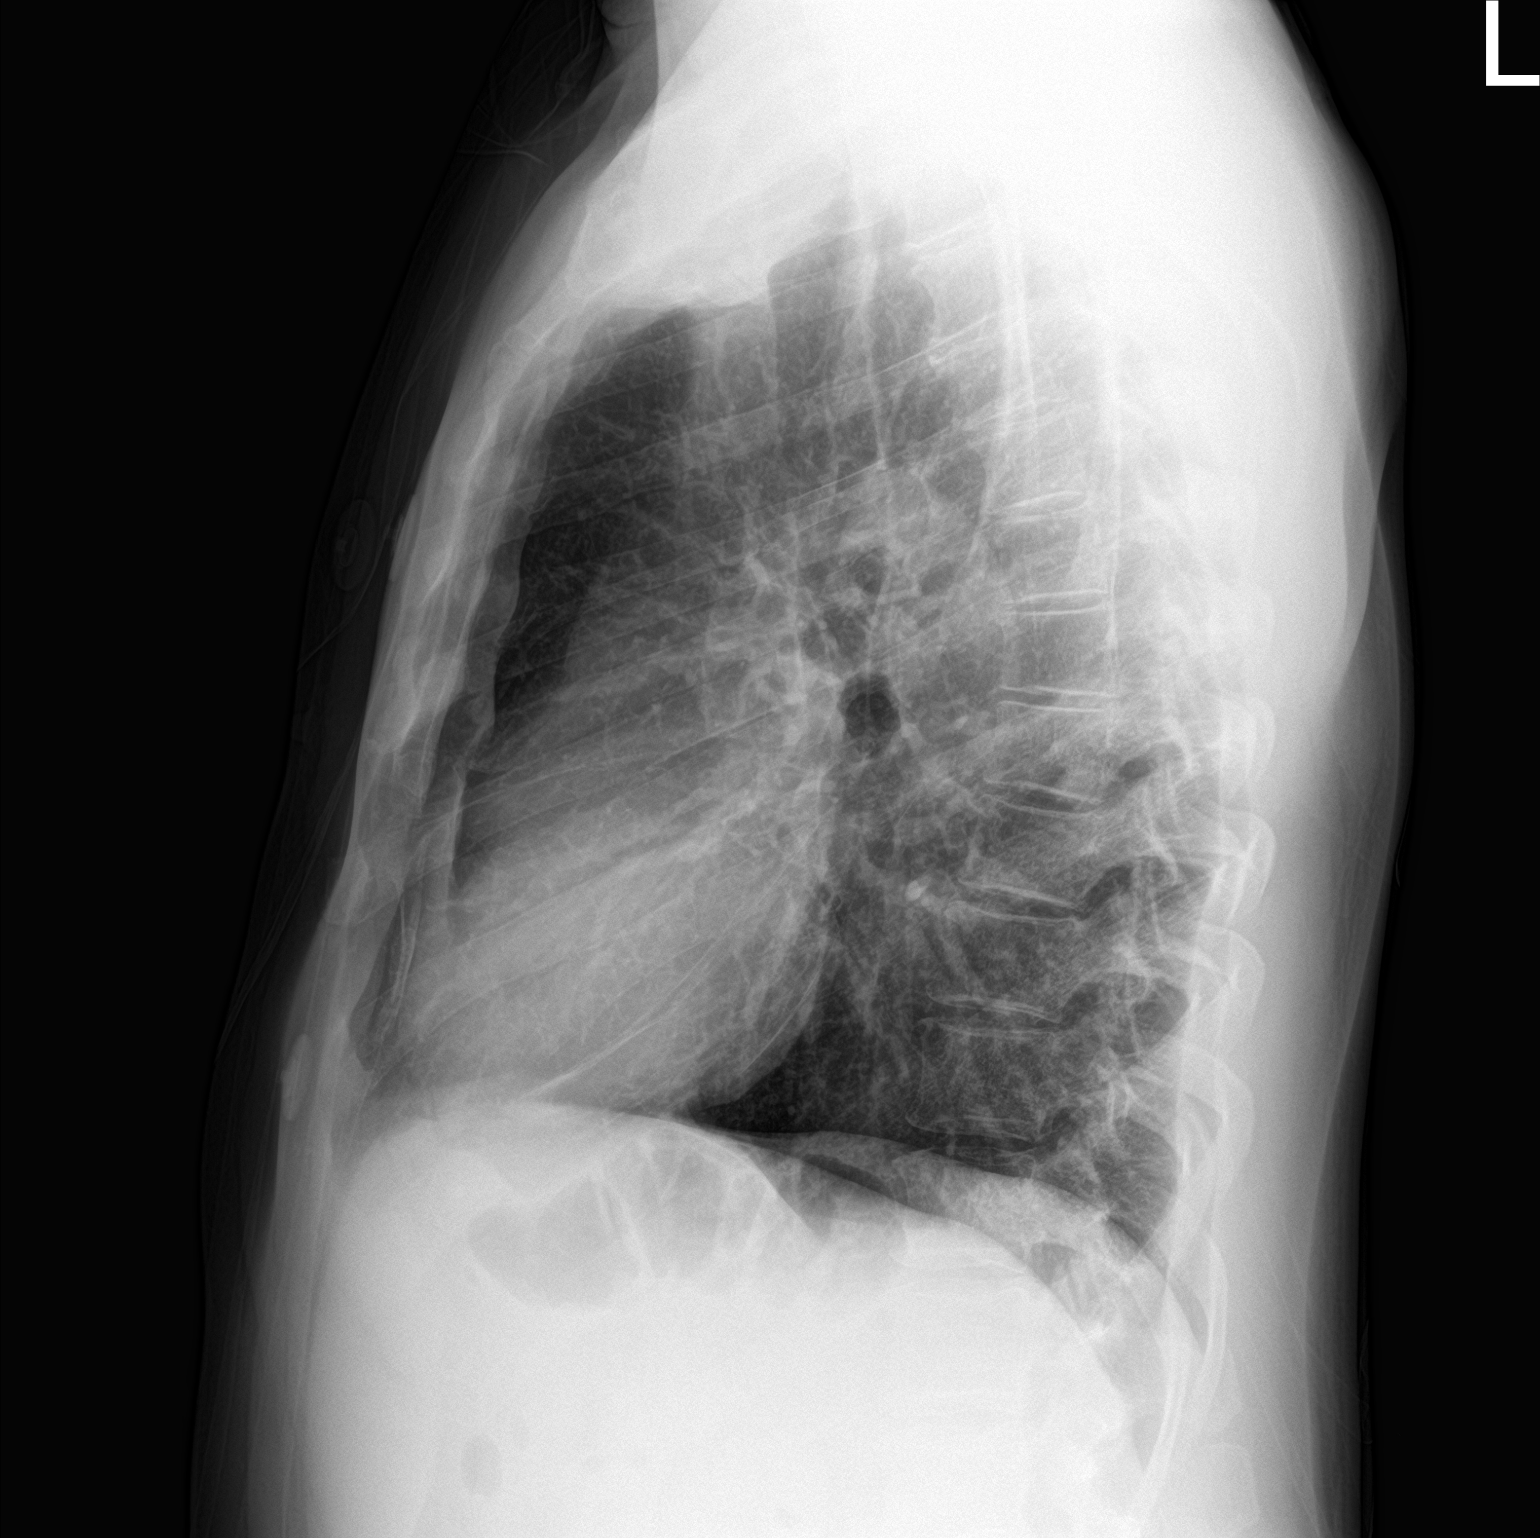

[2 of 2 positions shown; findings below may reference images not displayed]

FINDINGS: The heart size and mediastinal contours are within normal limits.
Both lungs are clear. The visualized skeletal structures are
unremarkable.
IMPRESSION: No active cardiopulmonary disease.

## 2022-01-15 IMAGING — CT CT ABD-PELV W/ CM
2 of 4 series · 16 of 46 positions shown, 18 images · IV contrast (omnipaque)
Comparison: None.

CLINICAL DATA: Abdominal pain

EXAM:
CT ABDOMEN AND PELVIS WITH CONTRAST
TECHNIQUE: Multidetector CT imaging of the abdomen and pelvis was performed
using the standard protocol following bolus administration of
intravenous contrast.
CONTRAST:  100mL OMNIPAQUE IOHEXOL 300 MG/ML SOLN, additional oral
enteric contrast

[Series 3: abd/ pelvis 5.0 i30f 2 · axial · 0.71mm/px · z∈[+742,+1148]mm · 13 of 89 slices shown, 15 images]
[im 4/89  soft-tissue]
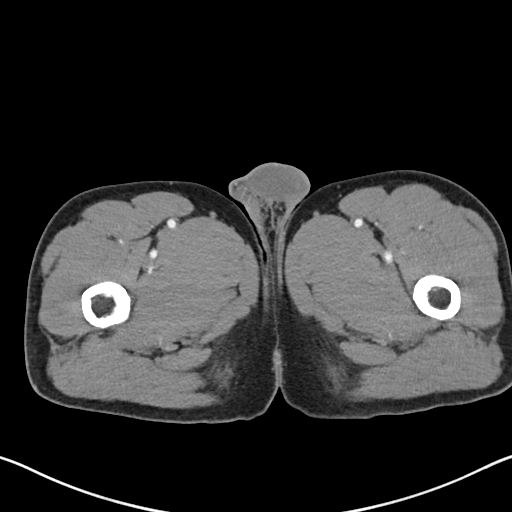
[im 4/89  bone]
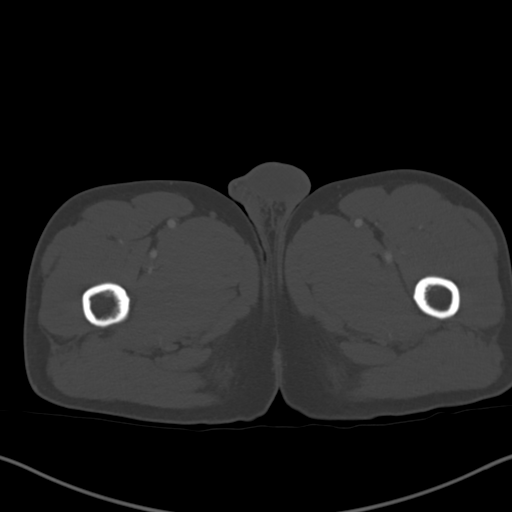
[im 12/89  soft-tissue]
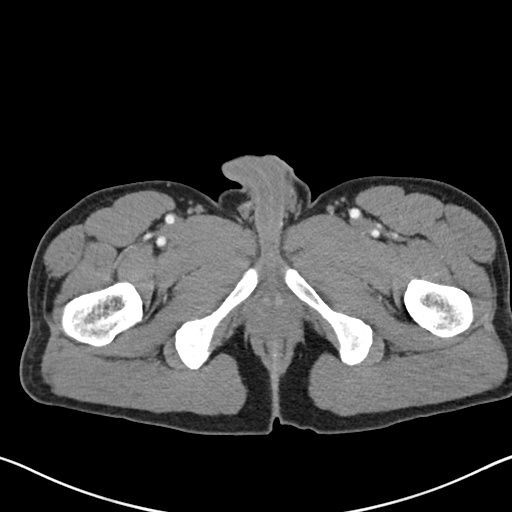
[im 19/89  soft-tissue]
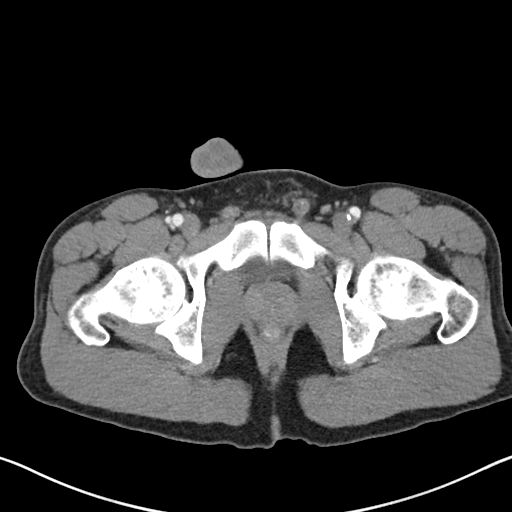
[im 26/89  soft-tissue]
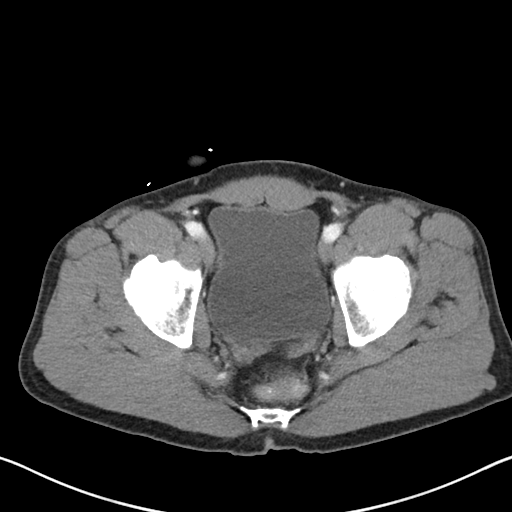
[im 30/89  soft-tissue]
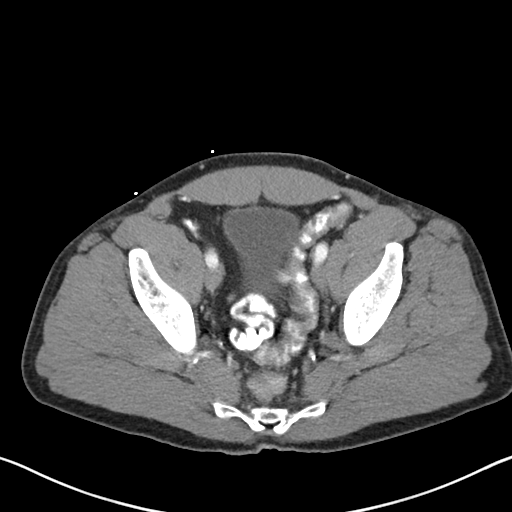
[im 37/89  soft-tissue]
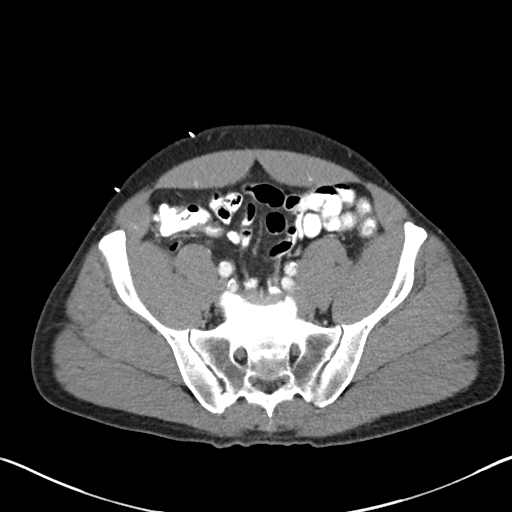
[im 45/89  soft-tissue]
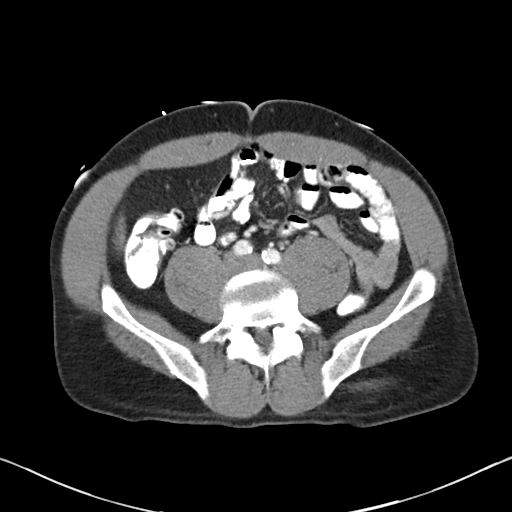
[im 52/89  soft-tissue]
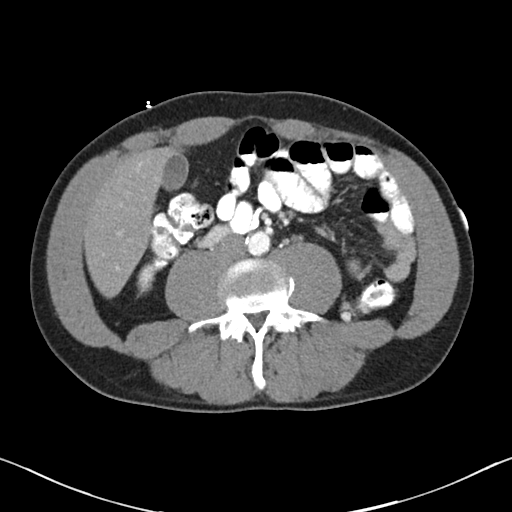
[im 59/89  soft-tissue]
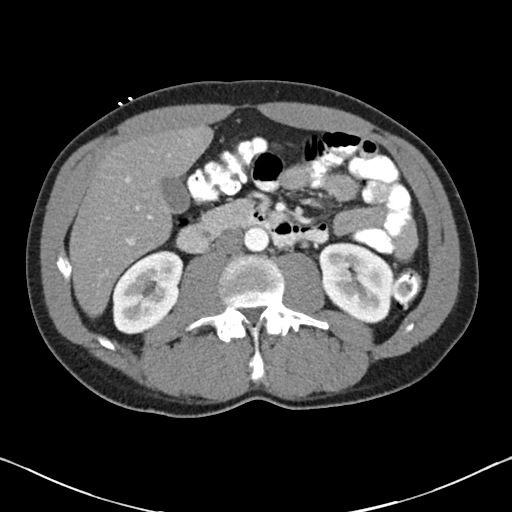
[im 59/89  bone]
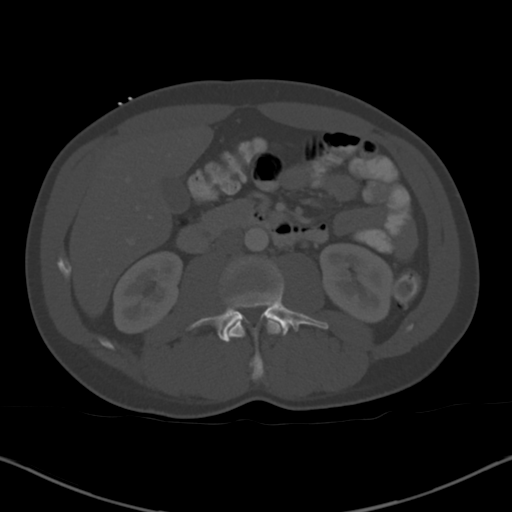
[im 63/89  soft-tissue]
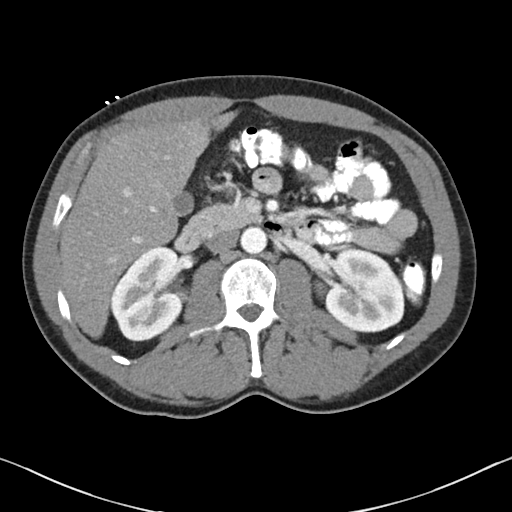
[im 70/89  soft-tissue]
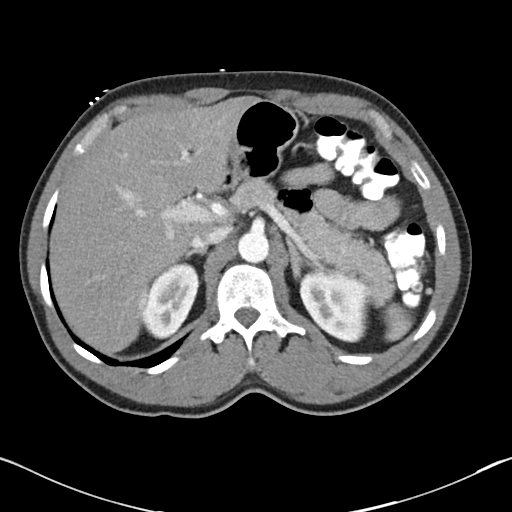
[im 78/89  soft-tissue]
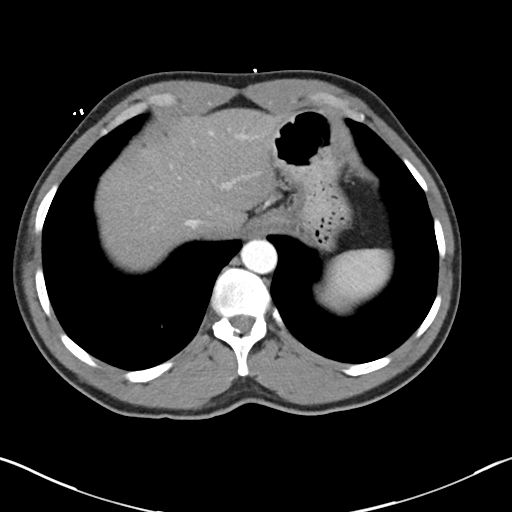
[im 85/89  soft-tissue]
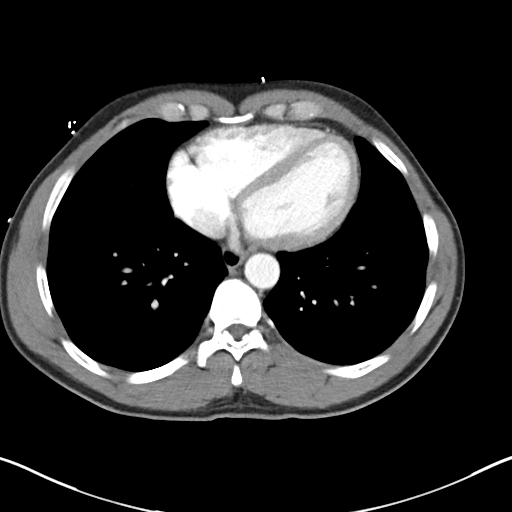

[Series 6: coronal soft tissue · coronal · 0.72mm/px · 3 of 101 slices shown]
[im 34/101  soft-tissue]
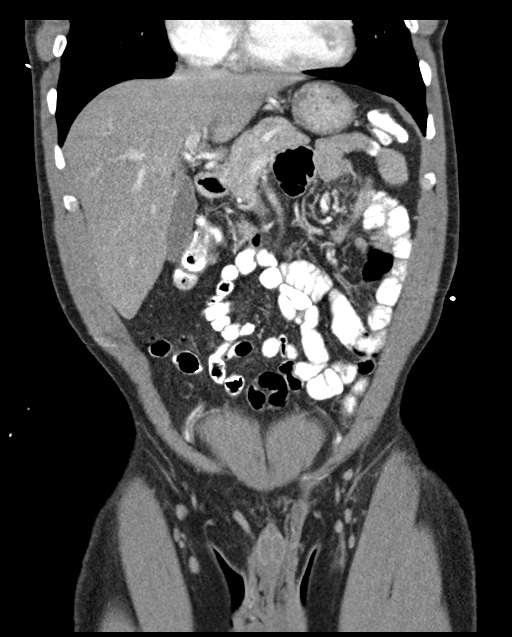
[im 45/101  soft-tissue]
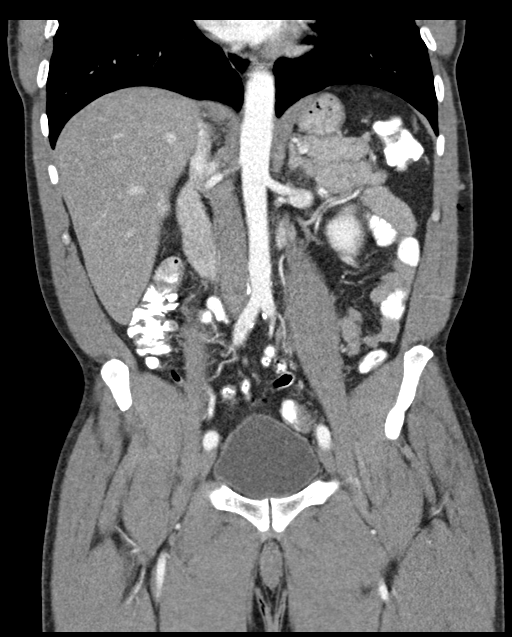
[im 56/101  soft-tissue]
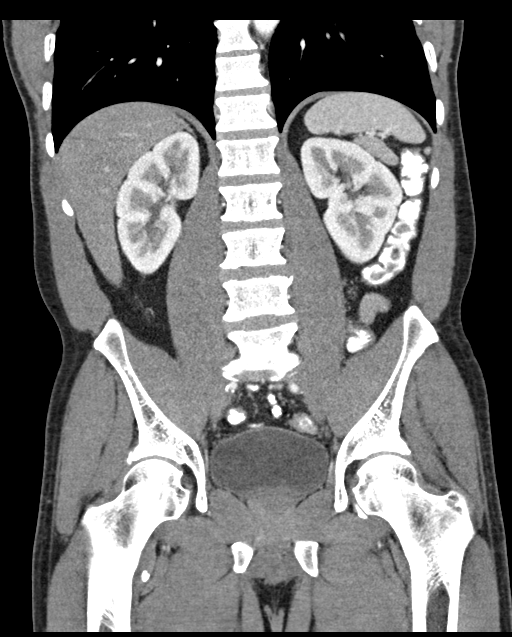

[16 of 46 positions shown; findings below may reference images not displayed]

FINDINGS: Lower chest: No acute abnormality.

Hepatobiliary: No solid liver abnormality is seen. Hepatic
steatosis. No gallstones, gallbladder wall thickening, or biliary
dilatation.

Pancreas: Unremarkable. No pancreatic ductal dilatation or
surrounding inflammatory changes.

Spleen: Normal in size without significant abnormality.

Adrenals/Urinary Tract: Adrenal glands are unremarkable. Kidneys are
normal, without renal calculi, solid lesion, or hydronephrosis.
Bladder is unremarkable.

Stomach/Bowel: Stomach is within normal limits. Appendix appears
normal. No evidence of bowel wall thickening, distention, or
inflammatory changes. Occasional descending and sigmoid diverticula.

Vascular/Lymphatic: Aortic atherosclerosis. No enlarged abdominal or
pelvic lymph nodes.

Reproductive: No mass or other significant abnormality.

Other: No abdominal wall hernia or abnormality. No abdominopelvic
ascites.

Musculoskeletal: No acute or significant osseous findings.
IMPRESSION: 1. No acute CT findings of the abdomen or pelvis to explain
abdominal pain.
2. Hepatic steatosis.
3. Occasional descending and sigmoid diverticula without evidence of
acute diverticulitis.

Aortic Atherosclerosis ([id number]).

## 2022-04-07 DIAGNOSIS — R7303 Prediabetes: Secondary | ICD-10-CM | POA: Diagnosis not present

## 2022-04-07 DIAGNOSIS — R030 Elevated blood-pressure reading, without diagnosis of hypertension: Secondary | ICD-10-CM | POA: Diagnosis not present

## 2022-05-23 DIAGNOSIS — Z125 Encounter for screening for malignant neoplasm of prostate: Secondary | ICD-10-CM | POA: Diagnosis not present

## 2022-05-23 DIAGNOSIS — Z0000 Encounter for general adult medical examination without abnormal findings: Secondary | ICD-10-CM | POA: Diagnosis not present

## 2022-06-02 DIAGNOSIS — Z0000 Encounter for general adult medical examination without abnormal findings: Secondary | ICD-10-CM | POA: Diagnosis not present

## 2022-06-02 DIAGNOSIS — I10 Essential (primary) hypertension: Secondary | ICD-10-CM | POA: Diagnosis not present

## 2022-06-02 DIAGNOSIS — R7303 Prediabetes: Secondary | ICD-10-CM | POA: Diagnosis not present

## 2022-08-04 DIAGNOSIS — R7303 Prediabetes: Secondary | ICD-10-CM | POA: Diagnosis not present

## 2022-08-04 DIAGNOSIS — I10 Essential (primary) hypertension: Secondary | ICD-10-CM | POA: Diagnosis not present

## 2022-11-03 DIAGNOSIS — I10 Essential (primary) hypertension: Secondary | ICD-10-CM | POA: Diagnosis not present

## 2022-11-03 DIAGNOSIS — R7301 Impaired fasting glucose: Secondary | ICD-10-CM | POA: Diagnosis not present

## 2022-11-03 DIAGNOSIS — M5136 Other intervertebral disc degeneration, lumbar region: Secondary | ICD-10-CM | POA: Diagnosis not present

## 2022-11-29 DIAGNOSIS — L309 Dermatitis, unspecified: Secondary | ICD-10-CM | POA: Diagnosis not present

## 2022-12-13 DIAGNOSIS — L309 Dermatitis, unspecified: Secondary | ICD-10-CM | POA: Diagnosis not present

## 2023-01-03 DIAGNOSIS — L309 Dermatitis, unspecified: Secondary | ICD-10-CM | POA: Diagnosis not present

## 2023-05-29 DIAGNOSIS — Z0000 Encounter for general adult medical examination without abnormal findings: Secondary | ICD-10-CM | POA: Diagnosis not present

## 2023-05-29 DIAGNOSIS — R7303 Prediabetes: Secondary | ICD-10-CM | POA: Diagnosis not present

## 2023-05-29 DIAGNOSIS — I10 Essential (primary) hypertension: Secondary | ICD-10-CM | POA: Diagnosis not present

## 2023-05-29 DIAGNOSIS — R7301 Impaired fasting glucose: Secondary | ICD-10-CM | POA: Diagnosis not present

## 2023-06-08 DIAGNOSIS — Z0000 Encounter for general adult medical examination without abnormal findings: Secondary | ICD-10-CM | POA: Diagnosis not present

## 2023-06-08 DIAGNOSIS — I10 Essential (primary) hypertension: Secondary | ICD-10-CM | POA: Diagnosis not present

## 2023-06-08 DIAGNOSIS — R7303 Prediabetes: Secondary | ICD-10-CM | POA: Diagnosis not present

## 2023-11-16 DIAGNOSIS — R7303 Prediabetes: Secondary | ICD-10-CM | POA: Diagnosis not present

## 2023-11-16 DIAGNOSIS — I10 Essential (primary) hypertension: Secondary | ICD-10-CM | POA: Diagnosis not present

## 2023-11-23 DIAGNOSIS — R7303 Prediabetes: Secondary | ICD-10-CM | POA: Diagnosis not present

## 2023-11-23 DIAGNOSIS — I10 Essential (primary) hypertension: Secondary | ICD-10-CM | POA: Diagnosis not present

## 2024-06-13 DIAGNOSIS — Z0000 Encounter for general adult medical examination without abnormal findings: Secondary | ICD-10-CM | POA: Diagnosis not present

## 2024-06-13 DIAGNOSIS — I10 Essential (primary) hypertension: Secondary | ICD-10-CM | POA: Diagnosis not present

## 2024-06-13 DIAGNOSIS — R7303 Prediabetes: Secondary | ICD-10-CM | POA: Diagnosis not present
